# Patient Record
Sex: Male | Born: 1955 | Race: White | Hispanic: No | Marital: Married | Smoking: Former smoker
Health system: Southern US, Community
[De-identification: ages and names within clinical notes are randomized; demographics above are authoritative.]

## PROBLEM LIST (undated history)

## (undated) DIAGNOSIS — Z8739 Personal history of other diseases of the musculoskeletal system and connective tissue: Secondary | ICD-10-CM

## (undated) DIAGNOSIS — E785 Hyperlipidemia, unspecified: Secondary | ICD-10-CM

## (undated) DIAGNOSIS — I10 Essential (primary) hypertension: Secondary | ICD-10-CM

## (undated) DIAGNOSIS — I2510 Atherosclerotic heart disease of native coronary artery without angina pectoris: Secondary | ICD-10-CM

## (undated) HISTORY — PX: TOOTH EXTRACTION: SUR596

## (undated) HISTORY — PX: TONSILECTOMY, ADENOIDECTOMY, BILATERAL MYRINGOTOMY AND TUBES: SHX2538

## (undated) HISTORY — PX: TONSILLECTOMY AND ADENOIDECTOMY: SUR1326

## (undated) HISTORY — PX: FRACTURE SURGERY: SHX138

---

## 1988-01-16 HISTORY — PX: PERCUTANEOUS PINNING PHALANX FRACTURE OF HAND: SUR1027

## 2010-02-05 ENCOUNTER — Encounter: Payer: Self-pay | Admitting: Orthopaedic Surgery

## 2016-05-09 ENCOUNTER — Ambulatory Visit: Payer: Self-pay | Admitting: Cardiovascular Disease

## 2016-05-10 ENCOUNTER — Encounter: Payer: Self-pay | Admitting: Cardiovascular Disease

## 2016-05-10 ENCOUNTER — Encounter: Payer: Self-pay | Admitting: *Deleted

## 2016-05-10 ENCOUNTER — Ambulatory Visit (INDEPENDENT_AMBULATORY_CARE_PROVIDER_SITE_OTHER): Payer: PRIVATE HEALTH INSURANCE | Admitting: Cardiovascular Disease

## 2016-05-10 VITALS — BP 177/107 | HR 66 | Ht 71.0 in | Wt 223.6 lb

## 2016-05-10 DIAGNOSIS — R030 Elevated blood-pressure reading, without diagnosis of hypertension: Secondary | ICD-10-CM | POA: Diagnosis not present

## 2016-05-10 DIAGNOSIS — R072 Precordial pain: Secondary | ICD-10-CM | POA: Diagnosis not present

## 2016-05-10 MED ORDER — ASPIRIN EC 81 MG PO TBEC: 81.0000 mg | DELAYED_RELEASE_TABLET | Freq: Every day | ORAL | Status: AC

## 2016-05-10 NOTE — Progress Notes (Signed)
CARDIOLOGY CONSULT NOTE  Patient ID: Ronald Johnston MRN: [id number] DOB/AGE: Nov 24, 1955 61 y.o.  Admit date: (Not on file) Primary Physician: [name], MD Referring Physician: Neita Carp  Reason for Consultation: chest pain  HPI: Laurin Morgenstern is a 61 y.o. male who is being seen today for the evaluation of chest pain at the request of Sasser, Clarene Critchley, MD.   I was called by Dr. Neita Carp yesterday regarding this patient.   He is hypertensive, BP 164/108.  I personally reviewed an ECG performed on 11/10/15 which showed normal sinus rhythm with no ischemic abnormalities.  I personally reviewed an ECG performed on 05/08/16 which showed sinus rhythm with RSR prime pattern in V1 with no ischemic ST segment or T-wave abnormalities, nor any arrhythmias.  For the past few weeks, he has experienced chest tightness with exertion. Symptoms last about 30-60 seconds. He has been taking Tums with near immediate relief. He has also stopped activities at roughly the same time. He has had some mild associated shortness of breath and some increasing fatigue over the past several months. He denies associated nausea, vomiting, diaphoresis, lightheadedness, dizziness, and syncope.  He said his blood pressure is usually normal and was 124/82 yesterday.  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 Has 3 children. Friends with Dr. Neita Carp.   No Known Allergies  Current Outpatient Prescriptions  Medication Sig Dispense Refill  . allopurinol (ZYLOPRIM) 300 MG tablet TAKE 1 TABELT BY MOUTH DAILY  3   No current facility-administered medications for this visit.     No past medical history on file.  Past Surgical History:  Procedure Laterality Date  .  HAND SX    . TONSILECTOMY, ADENOIDECTOMY, BILATERAL MYRINGOTOMY AND TUBES    . TOOTH EXTRACTION      Social History   Social History  . Marital status: Unknown    Spouse name: N/A  . Number of children: N/A  . Years of education: N/A   Occupational History  . Not on file.   Social History Main Topics  . Smoking status: Never Smoker  . Smokeless tobacco: Never Used  . Alcohol use Not on file  . Drug use: Unknown  . Sexual activity: Not on file   Other Topics Concern  . Not on file   Social History Narrative  . No narrative on file     No family history of premature CAD in 1st degree relatives.  No outpatient prescriptions have been marked as taking for the 05/10/16 encounter (Office Visit) with Laqueta Linden, MD.      Review of systems complete and found to be negative unless listed above in HPI    Physical exam Blood pressure (!) 164/108, pulse 68, height  (1.803 m), weight 223 lb 9.6 oz (101.4 kg), SpO2 98 %. General: NAD Neck: No JVD, no thyromegaly or thyroid nodule.  Lungs: Clear to auscultation bilaterally with normal respiratory effort. CV: Nondisplaced PMI. Regular rate and rhythm, normal S1/S2, no S3/S4, no murmur.  No peripheral edema.  No carotid bruit.    Abdomen: Soft, nontender, obese.  Skin: Intact without lesions or rashes.  Neurologic: Alert and oriented x 3.  Psych: Normal affect. Extremities: No  clubbing or cyanosis.  HEENT: Normal.   ECG: Most recent ECG reviewed.   Labs: No results found for: K, BUN, CREATININE, ALT, TSH, HGB   Lipids: No results found for: LDLCALC, LDLDIRECT, CHOL, TRIG, HDL      ASSESSMENT AND PLAN:  1. Precordial tightness: He has exertional chest tightness which is suspicious for angina. He stops activities and symptoms resolved. He also takes Tums with relief. I will order a 2-D echocardiogram with Doppler to evaluate cardiac structure, function, and regional wall motion. I will proceed with a  nuclear myocardial perfusion imaging study to evaluate for ischemic heart disease (exercise Myoview). He has already been prescribed nitroglycerin by his PCP. I explained to him how to use this. I will start aspirin 81 mg daily. I will obtain a copy of lipids and other labs from PCP for review.  2. Elevated BP: I will monitor this. It is reportedly usually normal.   Disposition: Follow up in 2 weeks  Signed: Prentice Docker, M.D., F.A.C.C.  05/10/2016, 12:16 PM

## 2016-05-10 NOTE — Patient Instructions (Addendum)
Medication Instructions:   Begin Aspirin  daily.  Continue all other medications.    Labwork: none  Testing/Procedures:  Your physician has requested that you have an echocardiogram. Echocardiography is a painless test that uses sound waves to create images of your heart. It provides your doctor with information about the size and shape of your heart and how well your heart's chambers and valves are working. This procedure takes approximately one hour. There are no restrictions for this procedure.  Your physician has requested that you have en exercise stress myoview. For further information please visit [email or web address] Please follow instruction sheet, as given.  Office will contact with results via phone or letter.    Follow-Up: 2 weeks   Any Other Special Instructions Will Be Listed Below (If Applicable).  If you need a refill on your cardiac medications before your next appointment, please call your pharmacy.

## 2016-05-18 ENCOUNTER — Other Ambulatory Visit (HOSPITAL_COMMUNITY)
Admission: RE | Admit: 2016-05-18 | Discharge: 2016-05-18 | Disposition: A | Discharge status: Home / Self Care | Payer: PRIVATE HEALTH INSURANCE | Source: Ambulatory Visit | Attending: Cardiology | Admitting: Cardiology

## 2016-05-18 ENCOUNTER — Ambulatory Visit (HOSPITAL_COMMUNITY)
Admission: RE | Admit: 2016-05-18 | Discharge: 2016-05-18 | Disposition: A | Discharge status: Home / Self Care | Payer: PRIVATE HEALTH INSURANCE | Source: Ambulatory Visit | Attending: Cardiovascular Disease | Admitting: Cardiovascular Disease

## 2016-05-18 ENCOUNTER — Encounter (HOSPITAL_BASED_OUTPATIENT_CLINIC_OR_DEPARTMENT_OTHER)
Admission: RE | Admit: 2016-05-18 | Discharge: 2016-05-18 | Disposition: A | Discharge status: Home / Self Care | Payer: PRIVATE HEALTH INSURANCE | Source: Ambulatory Visit | Attending: Cardiovascular Disease | Admitting: Cardiovascular Disease

## 2016-05-18 ENCOUNTER — Telehealth: Payer: Self-pay

## 2016-05-18 ENCOUNTER — Encounter (HOSPITAL_COMMUNITY)
Admission: RE | Admit: 2016-05-18 | Discharge: 2016-05-18 | Disposition: A | Discharge status: Home / Self Care | Payer: PRIVATE HEALTH INSURANCE | Source: Ambulatory Visit | Attending: Cardiovascular Disease | Admitting: Cardiovascular Disease

## 2016-05-18 ENCOUNTER — Ambulatory Visit (HOSPITAL_COMMUNITY)
Admission: RE | Admit: 2016-05-18 | Discharge: 2016-05-18 | Disposition: A | Discharge status: Home / Self Care | Payer: PRIVATE HEALTH INSURANCE | Source: Ambulatory Visit | Attending: Cardiology | Admitting: Cardiology

## 2016-05-18 ENCOUNTER — Encounter (HOSPITAL_COMMUNITY): Payer: Self-pay

## 2016-05-18 DIAGNOSIS — R072 Precordial pain: Secondary | ICD-10-CM

## 2016-05-18 DIAGNOSIS — R9439 Abnormal result of other cardiovascular function study: Secondary | ICD-10-CM

## 2016-05-18 DIAGNOSIS — R030 Elevated blood-pressure reading, without diagnosis of hypertension: Secondary | ICD-10-CM | POA: Insufficient documentation

## 2016-05-18 DIAGNOSIS — I071 Rheumatic tricuspid insufficiency: Secondary | ICD-10-CM | POA: Diagnosis not present

## 2016-05-18 LAB — ECHOCARDIOGRAM COMPLETE
CHL CUP DOP CALC LVOT VTI: 23 cm
CHL CUP RV SYS PRESS: 17 mmHg
CHL CUP STROKE VOLUME: 61 mL
E/e' ratio: 4.61
EWDT: 232 ms
FS: 31 % (ref 28–44)
IV/PV OW: 1.04
LA ID, A-P, ES: 35 mm
LA diam index: 1.58 cm/m2
LA vol A4C: 68.7 ml
LAVOL: 66.6 mL
LAVOLIN: 30.1 mL/m2
LDCA: 3.46 cm2
LEFT ATRIUM END SYS DIAM: 35 mm
LV E/e' medial: 4.61
LV E/e'average: 4.61
LV dias vol: 100 mL (ref 62–150)
LV sys vol index: 18 mL/m2
LVDIAVOLIN: 45 mL/m2
LVELAT: 12.1 cm/s
LVOT SV: 80 mL
LVOT peak grad rest: 5 mmHg
LVOTD: 21 mm
LVOTPV: 108 cm/s
LVSYSVOL: 39 mL (ref 21–61)
MV Dec: 232
MV pk A vel: 55.5 m/s
MV pk E vel: 55.8 m/s
P 1/2 time: 509 ms
PW: 10.3 mm — AB (ref 0.6–1.1)
RV LATERAL S' VELOCITY: 10.4 cm/s
RV TAPSE: 17.1 mm
Reg peak vel: 184 cm/s
Simpson's disk: 61
TDI e' lateral: 12.1
TDI e' medial: 7.07
TR max vel: 184 cm/s

## 2016-05-18 LAB — CBC WITH DIFFERENTIAL/PLATELET
Basophils Absolute: 0 10*3/uL (ref 0.0–0.1)
Basophils Relative: 0 %
EOS PCT: 3 %
Eosinophils Absolute: 0.2 10*3/uL (ref 0.0–0.7)
HCT: 47.9 % (ref 39.0–52.0)
Hemoglobin: 16.3 g/dL (ref 13.0–17.0)
LYMPHS ABS: 2.5 10*3/uL (ref 0.7–4.0)
LYMPHS PCT: 36 %
MCH: 31.5 pg (ref 26.0–34.0)
MCHC: 34 g/dL (ref 30.0–36.0)
MCV: 92.5 fL (ref 78.0–100.0)
MONO ABS: 0.8 10*3/uL (ref 0.1–1.0)
MONOS PCT: 12 %
Neutro Abs: 3.5 10*3/uL (ref 1.7–7.7)
Neutrophils Relative %: 49 %
PLATELETS: 248 10*3/uL (ref 150–400)
RBC: 5.18 MIL/uL (ref 4.22–5.81)
RDW: 13 % (ref 11.5–15.5)
WBC: 7 10*3/uL (ref 4.0–10.5)

## 2016-05-18 LAB — NM MYOCAR MULTI W/SPECT W/WALL MOTION / EF
CHL CUP NUCLEAR SDS: 10
CHL CUP RESTING HR STRESS: 64 {beats}/min
CHL RATE OF PERCEIVED EXERTION: 14
CSEPEDS: 34 s
CSEPPHR: 137 {beats}/min
Estimated workload: 8.9 METS
Exercise duration (min): 7 min
LVDIAVOL: 109 mL (ref 62–150)
LVSYSVOL: 59 mL
MPHR: 159 {beats}/min
Percent HR: 86 %
RATE: 0.36
SRS: 0
SSS: 10
TID: 1.11

## 2016-05-18 LAB — BASIC METABOLIC PANEL
Anion gap: 9 (ref 5–15)
BUN: 22 mg/dL — AB (ref 6–20)
CHLORIDE: 105 mmol/L (ref 101–111)
CO2: 23 mmol/L (ref 22–32)
Calcium: 10 mg/dL (ref 8.9–10.3)
Creatinine, Ser: 1.18 mg/dL (ref 0.61–1.24)
GFR calc Af Amer: >60 mL/min (ref >60)
GFR calc non Af Amer: >60 mL/min (ref >60)
GLUCOSE: 95 mg/dL (ref 65–99)
POTASSIUM: 3.7 mmol/L (ref 3.5–5.1)
Sodium: 137 mmol/L (ref 135–145)

## 2016-05-18 LAB — PROTIME-INR
INR: 0.97
Prothrombin Time: 12.9 seconds (ref 11.4–15.2)

## 2016-05-18 MED ORDER — SODIUM CHLORIDE 0.9% FLUSH
INTRAVENOUS | Status: AC
  Administered 2016-05-18: 10 mL via INTRAVENOUS
  Filled 2016-05-18: qty 10

## 2016-05-18 MED ORDER — REGADENOSON 0.4 MG/5ML IV SOLN
INTRAVENOUS | Status: AC
  Filled 2016-05-18: qty 5

## 2016-05-18 MED ORDER — TECHNETIUM TC 99M TETROFOSMIN IV KIT
30.0000 | PACK | Freq: Once | INTRAVENOUS | Status: AC | PRN
  Administered 2016-05-18: 30 via INTRAVENOUS

## 2016-05-18 MED ORDER — TECHNETIUM TC 99M TETROFOSMIN IV KIT
10.0000 | PACK | Freq: Once | INTRAVENOUS | Status: AC | PRN
  Administered 2016-05-18: 10 via INTRAVENOUS

## 2016-05-18 MED ORDER — SODIUM CHLORIDE 0.9% FLUSH
INTRAVENOUS | Status: AC
  Filled 2016-05-18: qty 200

## 2016-05-18 NOTE — Progress Notes (Signed)
*  PRELIMINARY RESULTS* Echocardiogram 2D Echocardiogram has been performed.  Stacey DrainWhite, Monica Zahler J 05/18/2016, 9:30 AM

## 2016-05-18 NOTE — Telephone Encounter (Signed)
@  [facility]  [facility] [place] [address] [place] [address] Dept: [phone] Loc: [phone]  Ronald FickleCarl E Johnston Ronald Johnston  05/18/2016  You are scheduled for a Cardiac Catheterization on Tuesday, May 8 with Dr. Verdis PrimeHenry Johnston.  1. [facility]: [address] [place], [place] [address] at 8:30 AM (two hours before your procedure to ensure your preparation). Free valet parking service is available.   Special note: Every effort is made to have your procedure done on time. Please understand that emergencies sometimes delay scheduled procedures.  2. Diet: Do not eat or drink anything after midnight prior to your procedure except sips of water to take medications.  3. Labs: Labs TODAY and chest x-ray  4. Medication instructions in preparation for your procedure:Take Aspirin 81 mg morning of cath  5. Plan for one night stay--bring personal belongings. 6. Bring a current list of your medications and current insurance cards. 7. You MUST have a responsible person to drive you home. 8. Someone MUST be with you the first 24 hours after you arrive home or your discharge will be delayed. 9. Please wear clothes that are easy to get on and off and wear slip-on shoes.  Thank you for allowing us to care for you!   -- [facility]

## 2016-05-18 NOTE — Telephone Encounter (Signed)
-----   Message from Antoine PocheJonathan F Branch, MD sent at 05/18/2016  3:40 PM EDT ----- Markedly abnormal stress test. I have spoken with patient today over phone, he agrees for cath. Please arrange for early next week. Needs left heart cath.   Dominga FerryJ Branch MD

## 2016-05-18 NOTE — Telephone Encounter (Signed)
Scheduled cath with Dr Katrinka BlazingSmith on Tues 5/8 17 at 1030  Dr Katrinka BlazingSmith  Pt will get labs and cxr today    Will forward to Dr Wyline MoodBranch to e-sign CAR order

## 2016-05-21 ENCOUNTER — Other Ambulatory Visit: Payer: Self-pay | Admitting: Cardiology

## 2016-05-21 ENCOUNTER — Telehealth: Payer: Self-pay | Admitting: *Deleted

## 2016-05-21 DIAGNOSIS — R0789 Other chest pain: Secondary | ICD-10-CM

## 2016-05-21 NOTE — Telephone Encounter (Signed)
Notes recorded by Lesle ChrisHill, Airica Schwartzkopf G, LPN on 2/8/41325/07/2016 at 9:38 AM EDT Patient notified. Copy to pmd. ------  Notes recorded by Laqueta LindenSuresh A Koneswaran, MD on 05/18/2016 at 10:03 AM EDT Normal pumping function.

## 2016-05-22 ENCOUNTER — Encounter (HOSPITAL_COMMUNITY)
Admission: RE | Disposition: A | Discharge status: Home / Self Care | Payer: Self-pay | Source: Ambulatory Visit | Attending: Interventional Cardiology

## 2016-05-22 ENCOUNTER — Other Ambulatory Visit: Payer: Self-pay

## 2016-05-22 ENCOUNTER — Ambulatory Visit (HOSPITAL_COMMUNITY)
Admission: RE | Admit: 2016-05-22 | Discharge: 2016-05-23 | Disposition: A | Discharge status: Home / Self Care | Payer: PRIVATE HEALTH INSURANCE | Source: Ambulatory Visit | Attending: Interventional Cardiology | Admitting: Interventional Cardiology

## 2016-05-22 ENCOUNTER — Encounter (HOSPITAL_COMMUNITY): Payer: Self-pay | Admitting: General Practice

## 2016-05-22 DIAGNOSIS — I25110 Atherosclerotic heart disease of native coronary artery with unstable angina pectoris: Secondary | ICD-10-CM | POA: Insufficient documentation

## 2016-05-22 DIAGNOSIS — I10 Essential (primary) hypertension: Secondary | ICD-10-CM | POA: Diagnosis not present

## 2016-05-22 DIAGNOSIS — I2510 Atherosclerotic heart disease of native coronary artery without angina pectoris: Secondary | ICD-10-CM

## 2016-05-22 DIAGNOSIS — Z955 Presence of coronary angioplasty implant and graft: Secondary | ICD-10-CM

## 2016-05-22 DIAGNOSIS — R931 Abnormal findings on diagnostic imaging of heart and coronary circulation: Secondary | ICD-10-CM

## 2016-05-22 DIAGNOSIS — R0789 Other chest pain: Secondary | ICD-10-CM

## 2016-05-22 DIAGNOSIS — E785 Hyperlipidemia, unspecified: Secondary | ICD-10-CM | POA: Diagnosis not present

## 2016-05-22 DIAGNOSIS — I200 Unstable angina: Secondary | ICD-10-CM | POA: Diagnosis present

## 2016-05-22 DIAGNOSIS — I209 Angina pectoris, unspecified: Secondary | ICD-10-CM

## 2016-05-22 HISTORY — DX: Personal history of other diseases of the musculoskeletal system and connective tissue: Z87.39

## 2016-05-22 HISTORY — PX: CORONARY STENT INTERVENTION: CATH118234

## 2016-05-22 HISTORY — DX: Essential (primary) hypertension: I10

## 2016-05-22 HISTORY — DX: Hyperlipidemia, unspecified: E78.5

## 2016-05-22 HISTORY — PX: CORONARY ANGIOPLASTY WITH STENT PLACEMENT: SHX49

## 2016-05-22 HISTORY — DX: Atherosclerotic heart disease of native coronary artery without angina pectoris: I25.10

## 2016-05-22 HISTORY — PX: LEFT HEART CATH AND CORONARY ANGIOGRAPHY: CATH118249

## 2016-05-22 LAB — POCT ACTIVATED CLOTTING TIME
ACTIVATED CLOTTING TIME: 290 s
ACTIVATED CLOTTING TIME: 307 s

## 2016-05-22 SURGERY — LEFT HEART CATH AND CORONARY ANGIOGRAPHY
Anesthesia: LOCAL

## 2016-05-22 MED ORDER — SODIUM CHLORIDE 0.9% FLUSH: 3.0000 mL | Freq: Two times a day (BID) | INTRAVENOUS | Status: DC

## 2016-05-22 MED ORDER — SODIUM CHLORIDE 0.9 % IV SOLN: 250.0000 mL | INTRAVENOUS | Status: DC | PRN

## 2016-05-22 MED ORDER — NITROGLYCERIN 1 MG/10 ML FOR IR/CATH LAB
INTRA_ARTERIAL | Status: DC | PRN
  Administered 2016-05-22: 200 ug via INTRACORONARY

## 2016-05-22 MED ORDER — SODIUM CHLORIDE 0.9 % WEIGHT BASED INFUSION: 1.0000 mL/kg/h | INTRAVENOUS | Status: DC

## 2016-05-22 MED ORDER — FENTANYL CITRATE (PF) 100 MCG/2ML IJ SOLN
INTRAMUSCULAR | Status: DC | PRN
  Administered 2016-05-22 (×2): 50 ug via INTRAVENOUS

## 2016-05-22 MED ORDER — OXYCODONE-ACETAMINOPHEN 5-325 MG PO TABS: 1.0000 | ORAL_TABLET | ORAL | Status: DC | PRN

## 2016-05-22 MED ORDER — MIDAZOLAM HCL 2 MG/2ML IJ SOLN
INTRAMUSCULAR | Status: AC
  Filled 2016-05-22: qty 2

## 2016-05-22 MED ORDER — IOPAMIDOL (ISOVUE-370) INJECTION 76%
INTRAVENOUS | Status: DC | PRN
  Administered 2016-05-22: 200 mL via INTRA_ARTERIAL

## 2016-05-22 MED ORDER — HEPARIN SODIUM (PORCINE) 1000 UNIT/ML IJ SOLN
INTRAMUSCULAR | Status: AC
  Filled 2016-05-22: qty 1

## 2016-05-22 MED ORDER — HYDRALAZINE HCL 20 MG/ML IJ SOLN
5.0000 mg | INTRAMUSCULAR | Status: AC | PRN
  Administered 2016-05-22: 5 mg via INTRAVENOUS
  Filled 2016-05-22: qty 1

## 2016-05-22 MED ORDER — HEPARIN (PORCINE) IN NACL 2-0.9 UNIT/ML-% IJ SOLN
INTRAMUSCULAR | Status: AC
  Filled 2016-05-22: qty 1000

## 2016-05-22 MED ORDER — IOPAMIDOL (ISOVUE-370) INJECTION 76%
INTRAVENOUS | Status: AC
  Filled 2016-05-22: qty 50

## 2016-05-22 MED ORDER — ACETAMINOPHEN 325 MG PO TABS: 650.0000 mg | ORAL_TABLET | ORAL | Status: DC | PRN

## 2016-05-22 MED ORDER — LIDOCAINE HCL (PF) 1 % IJ SOLN
INTRAMUSCULAR | Status: DC | PRN
  Administered 2016-05-22: 2 mL via INTRADERMAL

## 2016-05-22 MED ORDER — HEPARIN SODIUM (PORCINE) 5000 UNIT/ML IJ SOLN
5000.0000 [IU] | Freq: Three times a day (TID) | INTRAMUSCULAR | Status: DC
  Filled 2016-05-22: qty 1

## 2016-05-22 MED ORDER — LIDOCAINE HCL (PF) 1 % IJ SOLN
INTRAMUSCULAR | Status: AC
  Filled 2016-05-22: qty 30

## 2016-05-22 MED ORDER — ONDANSETRON HCL 4 MG/2ML IJ SOLN: 4.0000 mg | Freq: Four times a day (QID) | INTRAMUSCULAR | Status: DC | PRN

## 2016-05-22 MED ORDER — TICAGRELOR 90 MG PO TABS
90.0000 mg | ORAL_TABLET | Freq: Two times a day (BID) | ORAL | Status: DC
  Administered 2016-05-23: 01:00:00 90 mg via ORAL
  Filled 2016-05-22: qty 1

## 2016-05-22 MED ORDER — VERAPAMIL HCL 2.5 MG/ML IV SOLN
INTRAVENOUS | Status: AC
  Filled 2016-05-22: qty 2

## 2016-05-22 MED ORDER — SODIUM CHLORIDE 0.9% FLUSH: 3.0000 mL | INTRAVENOUS | Status: DC | PRN

## 2016-05-22 MED ORDER — FENTANYL CITRATE (PF) 100 MCG/2ML IJ SOLN
INTRAMUSCULAR | Status: AC
  Filled 2016-05-22: qty 2

## 2016-05-22 MED ORDER — ASPIRIN 81 MG PO CHEW: 81.0000 mg | CHEWABLE_TABLET | Freq: Every day | ORAL | Status: DC

## 2016-05-22 MED ORDER — LABETALOL HCL 5 MG/ML IV SOLN
10.0000 mg | INTRAVENOUS | Status: AC | PRN
  Administered 2016-05-22: 5 mg via INTRAVENOUS
  Filled 2016-05-22: qty 4

## 2016-05-22 MED ORDER — SODIUM CHLORIDE 0.9 % WEIGHT BASED INFUSION
3.0000 mL/kg/h | INTRAVENOUS | Status: DC
  Administered 2016-05-22: 3 mL/kg/h via INTRAVENOUS

## 2016-05-22 MED ORDER — IOPAMIDOL (ISOVUE-370) INJECTION 76%
INTRAVENOUS | Status: AC
  Filled 2016-05-22: qty 125

## 2016-05-22 MED ORDER — TICAGRELOR 90 MG PO TABS
ORAL_TABLET | ORAL | Status: AC
  Filled 2016-05-22: qty 2

## 2016-05-22 MED ORDER — IOPAMIDOL (ISOVUE-370) INJECTION 76%
INTRAVENOUS | Status: AC
  Filled 2016-05-22: qty 100

## 2016-05-22 MED ORDER — SODIUM CHLORIDE 0.9 % IV SOLN
INTRAVENOUS | Status: AC
  Administered 2016-05-22: 17:00:00 via INTRAVENOUS

## 2016-05-22 MED ORDER — VERAPAMIL HCL 2.5 MG/ML IV SOLN
INTRAVENOUS | Status: DC | PRN
  Administered 2016-05-22: 10 mL via INTRA_ARTERIAL

## 2016-05-22 MED ORDER — MIDAZOLAM HCL 2 MG/2ML IJ SOLN
INTRAMUSCULAR | Status: DC | PRN
  Administered 2016-05-22 (×3): 1 mg via INTRAVENOUS

## 2016-05-22 MED ORDER — HEPARIN (PORCINE) IN NACL 2-0.9 UNIT/ML-% IJ SOLN
INTRAMUSCULAR | Status: DC | PRN
  Administered 2016-05-22: 2000 mL via INTRA_ARTERIAL

## 2016-05-22 MED ORDER — NITROGLYCERIN 1 MG/10 ML FOR IR/CATH LAB
INTRA_ARTERIAL | Status: AC
  Filled 2016-05-22: qty 10

## 2016-05-22 MED ORDER — TICAGRELOR 90 MG PO TABS
ORAL_TABLET | ORAL | Status: DC | PRN
  Administered 2016-05-22: 180 mg via ORAL

## 2016-05-22 MED ORDER — ASPIRIN 81 MG PO CHEW: 81.0000 mg | CHEWABLE_TABLET | ORAL | Status: DC

## 2016-05-22 MED ORDER — HEPARIN SODIUM (PORCINE) 1000 UNIT/ML IJ SOLN
INTRAMUSCULAR | Status: DC | PRN
  Administered 2016-05-22: 6000 [IU] via INTRAVENOUS
  Administered 2016-05-22: 5000 [IU] via INTRAVENOUS
  Administered 2016-05-22: 2000 [IU] via INTRAVENOUS

## 2016-05-22 SURGICAL SUPPLY — 21 items
BALLN EMERGE MR 2.5X12 (BALLOONS) ×2
BALLN ~~LOC~~ EUPHORA RX 3.75X8 (BALLOONS) ×2
BALLOON EMERGE MR 2.5X12 (BALLOONS) IMPLANT
BALLOON ~~LOC~~ EUPHORA RX 3.75X8 (BALLOONS) IMPLANT
CATH INFINITI 5 FR JL3.5 (CATHETERS) ×2 IMPLANT
CATH INFINITI JR4 5F (CATHETERS) ×1 IMPLANT
CATH LAUNCHER 6FR EBU3.5 (CATHETERS) ×1 IMPLANT
CATH VISTA GUIDE 6FR XBLAD3.5 (CATHETERS) ×1 IMPLANT
COVER PRB 48X5XTLSCP FOLD TPE (BAG) IMPLANT
COVER PROBE 5X48 (BAG) ×2
DEVICE RAD COMP TR BAND LRG (VASCULAR PRODUCTS) ×1 IMPLANT
GLIDESHEATH SLEND A-KIT 6F 22G (SHEATH) ×1 IMPLANT
GUIDEWIRE INQWIRE 1.5J.035X260 (WIRE) IMPLANT
INQWIRE 1.5J .035X260CM (WIRE) ×2
KIT ENCORE 26 ADVANTAGE (KITS) ×2 IMPLANT
KIT HEART LEFT (KITS) ×2 IMPLANT
PACK CARDIAC CATHETERIZATION (CUSTOM PROCEDURE TRAY) ×2 IMPLANT
STENT RESOLUTE ONYX 3.5X15 (Permanent Stent) ×1 IMPLANT
TRANSDUCER W/STOPCOCK (MISCELLANEOUS) ×2 IMPLANT
TUBING CIL FLEX 10 FLL-RA (TUBING) ×2 IMPLANT
WIRE ASAHI PROWATER 180CM (WIRE) ×1 IMPLANT

## 2016-05-22 NOTE — Care Management Note (Addendum)
Case Management Note  Patient Details  Name: Ronald Johnston MRN: [id number] Date of Birth: 1955-01-25  Subjective/Objective:   From home with wife, s/p coronary stent intervention, will be on brilinta. NCM gave patient the 30 day savings card, [facility] [place] in [facility].                   Action/Plan: PTA indep, NCM will follow for dc needs also awaiting benefit check for brilinta.   Expected Discharge Date:                  Expected Discharge Plan:  Home/Self Care  In-House Referral:     Discharge planning Services  CM Consult  Post Acute Care Choice:    Choice offered to:     DME Arranged:    DME Agency:     HH Arranged:    HH Agency:     Status of Service:  In process, will continue to follow  If discussed at Long Length of Stay Meetings, dates discussed:    Additional Comments:  Leone Havenaylor, Umi Mainor Clinton, RN 05/22/2016, 4:25 PM

## 2016-05-22 NOTE — H&P (View-Only) (Signed)
CARDIOLOGY CONSULT NOTE  Patient ID: Ronald Johnston MRN: [id number] DOB/AGE: Nov 24, 1955 61 y.o.  Admit date: (Not on file) Primary Physician: [name], MD Referring Physician: Neita Carp  Reason for Consultation: chest pain  HPI: Ronald Johnston is a 61 y.o. male who is being seen today for the evaluation of chest pain at the request of Sasser, Clarene Critchley, MD.   I was called by Dr. Neita Carp yesterday regarding this patient.   He is hypertensive, BP 164/108.  I personally reviewed an ECG performed on 11/10/15 which showed normal sinus rhythm with no ischemic abnormalities.  I personally reviewed an ECG performed on 05/08/16 which showed sinus rhythm with RSR prime pattern in V1 with no ischemic ST segment or T-wave abnormalities, nor any arrhythmias.  For the past few weeks, he has experienced chest tightness with exertion. Symptoms last about 30-60 seconds. He has been taking Tums with near immediate relief. He has also stopped activities at roughly the same time. He has had some mild associated shortness of breath and some increasing fatigue over the past several months. He denies associated nausea, vomiting, diaphoresis, lightheadedness, dizziness, and syncope.  He said his blood pressure is usually normal and was 124/82 yesterday.  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 Has 3 children. Friends with Dr. Neita Carp.   No Known Allergies  Current Outpatient Prescriptions  Medication Sig Dispense Refill  . allopurinol (ZYLOPRIM) 300 MG tablet TAKE 1 TABELT BY MOUTH DAILY  3   No current facility-administered medications for this visit.     No past medical history on file.  Past Surgical History:  Procedure Laterality Date  .  HAND SX    . TONSILECTOMY, ADENOIDECTOMY, BILATERAL MYRINGOTOMY AND TUBES    . TOOTH EXTRACTION      Social History   Social History  . Marital status: Unknown    Spouse name: N/A  . Number of children: N/A  . Years of education: N/A   Occupational History  . Not on file.   Social History Main Topics  . Smoking status: Never Smoker  . Smokeless tobacco: Never Used  . Alcohol use Not on file  . Drug use: Unknown  . Sexual activity: Not on file   Other Topics Concern  . Not on file   Social History Narrative  . No narrative on file     No family history of premature CAD in 1st degree relatives.  No outpatient prescriptions have been marked as taking for the 05/10/16 encounter (Office Visit) with Laqueta Linden, MD.      Review of systems complete and found to be negative unless listed above in HPI    Physical exam Blood pressure (!) 164/108, pulse 68, height  (1.803 m), weight 223 lb 9.6 oz (101.4 kg), SpO2 98 %. General: NAD Neck: No JVD, no thyromegaly or thyroid nodule.  Lungs: Clear to auscultation bilaterally with normal respiratory effort. CV: Nondisplaced PMI. Regular rate and rhythm, normal S1/S2, no S3/S4, no murmur.  No peripheral edema.  No carotid bruit.    Abdomen: Soft, nontender, obese.  Skin: Intact without lesions or rashes.  Neurologic: Alert and oriented x 3.  Psych: Normal affect. Extremities: No  clubbing or cyanosis.  HEENT: Normal.   ECG: Most recent ECG reviewed.   Labs: No results found for: K, BUN, CREATININE, ALT, TSH, HGB   Lipids: No results found for: LDLCALC, LDLDIRECT, CHOL, TRIG, HDL      ASSESSMENT AND PLAN:  1. Precordial tightness: He has exertional chest tightness which is suspicious for angina. He stops activities and symptoms resolved. He also takes Tums with relief. I will order a 2-D echocardiogram with Doppler to evaluate cardiac structure, function, and regional wall motion. I will proceed with a  nuclear myocardial perfusion imaging study to evaluate for ischemic heart disease (exercise Myoview). He has already been prescribed nitroglycerin by his PCP. I explained to him how to use this. I will start aspirin 81 mg daily. I will obtain a copy of lipids and other labs from PCP for review.  2. Elevated BP: I will monitor this. It is reportedly usually normal.   Disposition: Follow up in 2 weeks  Signed: Prentice DockerSuresh Javares Kaufhold, M.D., F.A.C.C.  05/10/2016, 12:16 PM

## 2016-05-22 NOTE — Interval H&P Note (Signed)
Cath Lab Visit (complete for each Cath Lab visit)  Clinical Evaluation Leading to the Procedure:   ACS: No.  Non-ACS:    Anginal Classification: CCS III  Anti-ischemic medical therapy: Minimal Therapy (1 class of medications)  Non-Invasive Test Results: High-risk stress test findings: cardiac mortality >3%/year  Prior CABG: No previous CABG      History and Physical Interval Note:  05/22/2016 12:58 PM  Renae Ficklearl E Jr Dreier  has presented today for surgery, with the diagnosis of abnormal stress test  The various methods of treatment have been discussed with the patient and family. After consideration of risks, benefits and other options for treatment, the patient has consented to  Procedure(s): Left Heart Cath and Coronary Angiography (N/A) as a surgical intervention .  The patient's history has been reviewed, patient examined, no change in status, stable for surgery.  I have reviewed the patient's chart and labs.  Questions were answered to the patient's satisfaction.     Lyn RecordsHenry W Milfred Krammes III

## 2016-05-22 NOTE — Progress Notes (Signed)
#   7 S/W LOLA @ [facility] # [id number]    1.BRILINTA  90 MG BID     COVER- YES  CO-PAY- $ 70.00  TIER- 2 DRUG  PRIOR APPROVAL- NO   2. TICAGRELOR- NONE FORMULARY   3. CLOPIDOGREL 75 MG BID   COVER- YES  CO-PAY- $ 10.00  TIER- 3 DRUG  PRIOR APPROVAL- NO   [facility]   PLEASE ASK PATIENT TO GIVE COPY OF INS CARD TO ADMISSION

## 2016-05-22 NOTE — Progress Notes (Signed)
TR BAND REMOVAL  LOCATION:    right radial  DEFLATED PER PROTOCOL:    Yes.    TIME BAND OFF / DRESSING APPLIED:    1830   SITE UPON ARRIVAL:    Level 0  SITE AFTER BAND REMOVAL:    Level 0  CIRCULATION SENSATION AND MOVEMENT:    Within Normal Limits   Yes.    COMMENTS:  No complications with band removal

## 2016-05-22 NOTE — CV Procedure (Signed)
   The angiography demonstrated a Ruptured Plaque in the Proximal LAD and Moderate to Moderately Severe Mid LAD Disease beyond the Large Diagonal.  There are no lower the LAD is of the right coronary and circumflex.  Overall LV function is normal.  Successful stenting of focal region in the proximal LAD from 90+ percent to 0% with a 15 x 3.5 mm Aches DES Postdilated to 3.75 Mm.

## 2016-05-22 NOTE — Research (Addendum)
OPTIMIZE Informed Consent   Subject Name: Ronald Johnston  Subject met inclusion and exclusion criteria.  The informed consent form, study requirements and expectations were reviewed with the subject and questions and concerns were addressed prior to the signing of the consent form.  The subject verbalized understanding of the trail requirements.  The subject agreed to participate in the OPTIMIZE trial and signed the informed consent.  The informed consent was obtained prior to performance of any protocol-specific procedures for the subject.  A copy of the signed informed consent was given to the subject and a copy was placed in the subject's medical record. Applicable if randomized.   Philemon Kingdom D 05/22/2016, 0845 am

## 2016-05-23 ENCOUNTER — Encounter (HOSPITAL_COMMUNITY): Payer: Self-pay | Admitting: Interventional Cardiology

## 2016-05-23 DIAGNOSIS — I25110 Atherosclerotic heart disease of native coronary artery with unstable angina pectoris: Secondary | ICD-10-CM | POA: Diagnosis not present

## 2016-05-23 DIAGNOSIS — R931 Abnormal findings on diagnostic imaging of heart and coronary circulation: Secondary | ICD-10-CM

## 2016-05-23 DIAGNOSIS — I10 Essential (primary) hypertension: Secondary | ICD-10-CM

## 2016-05-23 DIAGNOSIS — I2510 Atherosclerotic heart disease of native coronary artery without angina pectoris: Secondary | ICD-10-CM

## 2016-05-23 DIAGNOSIS — E785 Hyperlipidemia, unspecified: Secondary | ICD-10-CM

## 2016-05-23 LAB — CBC
HCT: 45 % (ref 39.0–52.0)
HEMOGLOBIN: 15.3 g/dL (ref 13.0–17.0)
MCH: 31 pg (ref 26.0–34.0)
MCHC: 34 g/dL (ref 30.0–36.0)
MCV: 91.1 fL (ref 78.0–100.0)
Platelets: 249 10*3/uL (ref 150–400)
RBC: 4.94 MIL/uL (ref 4.22–5.81)
RDW: 13 % (ref 11.5–15.5)
WBC: 7.1 10*3/uL (ref 4.0–10.5)

## 2016-05-23 LAB — BASIC METABOLIC PANEL
ANION GAP: 9 (ref 5–15)
BUN: 13 mg/dL (ref 6–20)
CHLORIDE: 107 mmol/L (ref 101–111)
CO2: 21 mmol/L — ABNORMAL LOW (ref 22–32)
Calcium: 8.8 mg/dL — ABNORMAL LOW (ref 8.9–10.3)
Creatinine, Ser: 0.94 mg/dL (ref 0.61–1.24)
GFR calc Af Amer: >60 mL/min (ref >60)
GFR calc non Af Amer: >60 mL/min (ref >60)
Glucose, Bld: 94 mg/dL (ref 65–99)
Potassium: 3.6 mmol/L (ref 3.5–5.1)
Sodium: 137 mmol/L (ref 135–145)

## 2016-05-23 MED ORDER — METOPROLOL TARTRATE 12.5 MG HALF TABLET
12.5000 mg | ORAL_TABLET | Freq: Two times a day (BID) | ORAL | Status: DC
  Filled 2016-05-23: qty 1

## 2016-05-23 MED ORDER — ATORVASTATIN CALCIUM 80 MG PO TABS: 80.0000 mg | ORAL_TABLET | Freq: Every day | ORAL | 6 refills | Status: DC

## 2016-05-23 MED ORDER — ANGIOPLASTY BOOK
Freq: Once | Status: DC
  Filled 2016-05-23: qty 1

## 2016-05-23 MED ORDER — TICAGRELOR 90 MG PO TABS: 90.0000 mg | ORAL_TABLET | Freq: Two times a day (BID) | ORAL | 10 refills | Status: DC

## 2016-05-23 MED ORDER — METOPROLOL TARTRATE 25 MG PO TABS: 12.5000 mg | ORAL_TABLET | Freq: Two times a day (BID) | ORAL | 3 refills | Status: DC

## 2016-05-23 MED ORDER — TICAGRELOR 90 MG PO TABS: 90.0000 mg | ORAL_TABLET | Freq: Two times a day (BID) | ORAL | 0 refills | Status: DC

## 2016-05-23 MED ORDER — NITROGLYCERIN 0.4 MG SL SUBL: 0.4000 mg | SUBLINGUAL_TABLET | SUBLINGUAL | 3 refills | Status: DC | PRN

## 2016-05-23 NOTE — Care Management Note (Signed)
Case Management Note  Patient Details  Name: Ronald Johnston MRN: [id number] Date of Birth: 02/08/1955  Subjective/Objective:   From home with wife, s/p coronary stent intervention, will be on brilinta. NCM gave patient the 30 day savings card, [facility] [place] in [facility].  Informed patient  That if co pay of 70.00 is too high to let his MD know at follow up, his insurance will cover plavix for 1000.  Patient is for dc today.                               Action/Plan:   Expected Discharge Date:                  Expected Discharge Plan:  Home/Self Care  In-House Referral:     Discharge planning Services  CM Consult  Post Acute Care Choice:    Choice offered to:     DME Arranged:    DME Agency:     HH Arranged:    HH Agency:     Status of Service:  Completed, signed off  If discussed at [employer] Length of Stay Meetings, dates discussed:    Additional Comments:  Leone Havenaylor, Weber Monnier Clinton, RN 05/23/2016, 8:37 AM

## 2016-05-23 NOTE — Progress Notes (Signed)
Offered Pt a bath. Pt refused and requested a tube of toothpaste. Tech gave Pt oral supplies.

## 2016-05-23 NOTE — Progress Notes (Signed)
CARDIAC REHAB PHASE I   PRE:  Rate/Rhythm: 70 SR    BP: sitting 159/80    SaO2:   MODE:  Ambulation: 430 ft   POST:  Rate/Rhythm: 85 SR    BP: sitting 173/105 dinamapp, 140/90 manual     SaO2:   Tolerated well, no c/o. BP elevated although manual is better than machine. Ed completed with pt and wife. Voiced understanding and requests his [facility] be [facility] to [facility].  He will need to navigate work. [phone]  Harriet Massonandi Kristan Lamya Lausch CES, ACSM 05/23/2016 8:46 AM

## 2016-05-23 NOTE — Discharge Summary (Signed)
Discharge Summary    Patient ID: Ronald Johnston,  MRN: [id number], DOB/AGE: 1955/07/28 61 y.o.  Admit date: 05/22/2016 Discharge date: 05/23/2016  Primary Care Provider: Estanislado PandySasser, Paul W Primary Cardiologist: Purvis SheffieldKoneswaran   Discharge Diagnoses    Active Problems:   CAD (coronary artery disease), native coronary artery   Angina pectoris [facility](HCC)   Abnormal nuclear cardiac imaging test   Accelerating angina [facility](HCC)   Essential hypertension   Hyperlipidemia   Allergies No Known Allergies  Diagnostic Studies/Procedures    LHC: 05/22/16  Conclusion    Thrombotic eccentric 85-95% proximal LAD stenosis. Intermedius stenosis in the mid LAD over a more diffuse area.  Irregularities noted in the circumflex artery but no high-grade obstruction.  Widely patent right coronary vessel arises slightly anterior no the left sinus of Valsalva.  Successful angioplasty and stenting of the proximal LAD 90% stenosis with reduction to 0% using a 3.5 x 15 Onyx DES postdilated to 3.75 mm in diameter. TIMI grade 3 flow was noted. No complications.  Normal LV function. EF greater than 50%. LVEDP is normal.  Recommendations:   Aspirin and brilinta for at least 6 months.  Aggressive risk factor modification.  Cardiac rehabilitation, phase II  _____________   History of Present Illness     61 yo male who was referred to Dr. Purvis SheffieldKoneswaran by Dr. Neita CarpSasser for ongoing chest pain. In the office he described exertion chest tightness that was relieved by rest. His blood pressure was also noted to be elevated in the office. He was referred for a stress test that was abnormal with large area of moderate to severe ischemia in the anterior, apical and anteroseptal walls. He was referred for outpatient cardiac cath.   Hospital Course      Underwent cardiac cath with Dr. Katrinka BlazingSmith on 05/22/16 noted above with DES x 1 placed to the pLAD with TIMI grade 3 flow. Normal LV function noted, and normal LVEDP. Plan for DAPT  ASA and Brilinta for one year. His blood pressure remained elevated this admission. Therefore will add metoprolol 12.5mg  BID. Also add high dose statin at Lipitor 80mg . Plan for follow up CMET and Lipid panel at office visit. Denies any chest pain overnight. Worked well with cardiac rehab.   He was seen and assessed by Dr. Excell Seltzerooper and determined stable for discharge home. Follow up has been arranged in the office. He was given a work noted prior to discharge. Medications are listed below.   General: Well developed, well nourished, male appearing in no acute distress. Head: Normocephalic, atraumatic.  Neck: Supple without bruits, JVD. Lungs:  Resp regular and unlabored, CTA. Heart: RRR, S1, S2, no S3, S4, or murmur; no rub. Abdomen: Soft, non-tender, non-distended with normoactive bowel sounds. No hepatomegaly. No rebound/guarding. No obvious abdominal masses. Extremities: No clubbing, cyanosis, edema. Distal pedal pulses are 2+ bilaterally. L radial cath site stable without bruising or hematoma Neuro: Alert and oriented X 3. Moves all extremities spontaneously. Psych: Normal affect.  _____________  Discharge Vitals Blood pressure (!) 173/105, pulse 67, temperature 98 F (36.7 C), temperature source Oral, resp. rate 16, height 5\' 11"  (1.803 m), weight 207 lb 3.7 oz (94 kg), SpO2 96 %.  Filed Weights   05/22/16 0829 05/23/16 0630  Weight: 216 lb (98 kg) 207 lb 3.7 oz (94 kg)    Labs & Radiologic Studies    CBC  Recent Labs  05/23/16 0300  WBC 7.1  HGB 15.3  HCT 45.0  MCV 91.1  PLT 249   Basic Metabolic Panel  Recent Labs  05/23/16 0300  NA 137  K 3.6  CL 107  CO2 21*  GLUCOSE 94  BUN 13  CREATININE 0.94  CALCIUM 8.8*   Liver Function Tests No results for input(s): AST, ALT, ALKPHOS, BILITOT, PROT, ALBUMIN in the last 72 hours. No results for input(s): LIPASE, AMYLASE in the last 72 hours. Cardiac Enzymes No results for input(s): CKTOTAL, CKMB, CKMBINDEX, TROPONINI  in the last 72 hours. BNP Invalid input(s): POCBNP D-Dimer No results for input(s): DDIMER in the last 72 hours. Hemoglobin A1C No results for input(s): HGBA1C in the last 72 hours. Fasting Lipid Panel No results for input(s): CHOL, HDL, LDLCALC, TRIG, CHOLHDL, LDLDIRECT in the last 72 hours. Thyroid Function Tests No results for input(s): TSH, T4TOTAL, T3FREE, THYROIDAB in the last 72 hours.  Invalid input(s): FREET3 _____________  Dg Chest 2 View  Result Date: 05/18/2016 CLINICAL DATA:  Chest tightness and shortness of breath. EXAM: CHEST  2 VIEW COMPARISON:  None. FINDINGS: The heart size and mediastinal contours are within normal limits. Both lungs are clear. The visualized skeletal structures are unremarkable. IMPRESSION: No active cardiopulmonary disease. Electronically Signed   By: Gerome Sam III M.D   On: 05/18/2016 20:33   Nm Myocar Multi W/spect Izetta Dakin Motion / Ef  Result Date: 05/18/2016  Blood pressure demonstrated a normal response to exercise.  Downsloping ST segment depression ST segment depression of 4 mm was noted during stress in the II, III, aVF, V4, V5 and V6 leads.  Findings consistent with large area of moderate to severe ischemia in the anterior, apical, and anteroseptal walls  This is a high risk study. High risk based on high risk exercise and imaging findings. High risk Duke treadmill score of negative 12.  The left ventricular ejection fraction is moderately decreased (30-44%).    Disposition   Pt is being discharged home today in good condition.  Follow-up Plans & Appointments    Follow-up Information    Laurann Montana, PA-C Follow up on 06/01/2016.   Specialties:  Cardiology, Radiology Why:  at 1:30pm for your follow up appt.  Contact information: [address] [place] [address] [phone]          Discharge Instructions    Amb Referral to Cardiac Rehabilitation    Complete by:  As directed    Diagnosis:   PTCA Coronary Stents       Call MD for:  redness, tenderness, or signs of infection (pain, swelling, redness, odor or green/yellow discharge around incision site)    Complete by:  As directed    Diet - low sodium heart healthy    Complete by:  As directed    Discharge instructions    Complete by:  As directed    Radial Site Care Refer to this sheet in the next few weeks. These instructions provide you with information on caring for yourself after your procedure. Your caregiver may also give you more specific instructions. Your treatment has been planned according to current medical practices, but problems sometimes occur. Call your caregiver if you have any problems or questions after your procedure. HOME CARE INSTRUCTIONS You may shower the day after the procedure.Remove the bandage (dressing) and gently wash the site with plain soap and water.Gently pat the site dry.  Do not apply powder or lotion to the site.  Do not submerge the affected site in water for 3 to 5 days.  Inspect the site  at least twice daily.  Do not flex or bend the affected arm for 24 hours.  No lifting over 5 pounds (2.3 kg) for 5 days after your procedure.  Do not drive home if you are discharged the same day of the procedure. Have someone else drive you.  You may drive 24 hours after the procedure unless otherwise instructed by your caregiver.  What to expect: Any bruising will usually fade within 1 to 2 weeks.  Blood that collects in the tissue (hematoma) may be painful to the touch. It should usually decrease in size and tenderness within 1 to 2 weeks.  SEEK IMMEDIATE MEDICAL CARE IF: You have unusual pain at the radial site.  You have redness, warmth, swelling, or pain at the radial site.  You have drainage (other than a small amount of blood on the dressing).  You have chills.  You have a fever or persistent symptoms for more than 72 hours.  You have a fever and your symptoms suddenly get worse.  Your arm becomes pale, cool, tingly,  or numb.  You have heavy bleeding from the site. Hold pressure on the site.   Increase activity slowly    Complete by:  As directed       Discharge Medications   Current Discharge Medication List    START taking these medications   Details  atorvastatin (LIPITOR) 80 MG tablet Take 1 tablet (80 mg total) by mouth daily. Qty: 30 tablet, Refills: 6    metoprolol tartrate (LOPRESSOR) 25 MG tablet Take 0.5 tablets (12.5 mg total) by mouth 2 (two) times daily. Qty: 60 tablet, Refills: 3    nitroGLYCERIN (NITROSTAT) 0.4 MG SL tablet Place 1 tablet (0.4 mg total) under the tongue every 5 (five) minutes as needed. Qty: 25 tablet, Refills: 3    ticagrelor (BRILINTA) 90 MG TABS tablet Take 1 tablet (90 mg total) by mouth 2 (two) times daily. Qty: 60 tablet, Refills: 0      CONTINUE these medications which have NOT CHANGED   Details  allopurinol (ZYLOPRIM) 300 MG tablet TAKE 1 TABELT BY MOUTH DAILY Refills: 3    aspirin EC 81 MG tablet Take 1 tablet (81 mg total) by mouth daily.         Aspirin prescribed at discharge?  Yes High Intensity Statin Prescribed? (Lipitor 40-80mg  or Crestor 20-40mg ): Yes Beta Blocker Prescribed? Yes For EF <40%, was ACEI/ARB Prescribed? No: Consider at outpatient follow up ADP Receptor Inhibitor Prescribed? (i.e. Plavix etc.-Includes Medically Managed Patients): Yes For EF <40%, Aldosterone Inhibitor Prescribed? No: EF ok Was EF assessed during THIS hospitalization? Yes  Was Cardiac Rehab II ordered? (Included Medically managed Patients): Yes   Outstanding Labs/Studies   CMET/Lipid panel at follow up visit. FLP/LFTs in 6 weeks if able to tolerate statin.   Duration of Discharge Encounter   Greater than 30 minutes including physician time.  Signed, Laverda Page NP-C 05/23/2016, 8:58 AM  Patient seen, examined. Available data reviewed. Agree with findings, assessment, and plan as outlined by Laverda Page, NP-C. Pt independently evaluated.  Exam reveals: Pt is alert and oriented, NAD HEENT: normal Neck: JVP - normal Lungs: CTA bilaterally CV: RRR without murmur or gallop Abd: soft, NT Ext: no C/C/E, distal pulses intact and equal, right radial site clear Skin: warm/dry no rash  Reviewed post-PCI instructions and medical program with patient. He is stable for discharge. Plans as outlined above.   Tonny Bollman, M.D. 05/23/2016 9:00 AM

## 2016-05-24 ENCOUNTER — Telehealth: Payer: Self-pay

## 2016-05-24 ENCOUNTER — Encounter (HOSPITAL_COMMUNITY): Payer: Self-pay | Admitting: Interventional Cardiology

## 2016-05-24 NOTE — Telephone Encounter (Signed)
Patient contacted regarding discharge from [facility] on 06/01/16.  Patient understands to follow up with provider Dunn PA-C on 06/01/16 at 130pm at [place]. Patient understands discharge instructions? yes Patient understands medications and regiment? yes Patient understands to bring all medications to this visit? yes

## 2016-05-24 NOTE — Telephone Encounter (Signed)
-----   Message from Vallarie MareHannah E Peters sent at 05/24/2016 10:50 AM EDT ----- TCM w/ D. Dunn on 5/18   Thank you

## 2016-05-25 ENCOUNTER — Ambulatory Visit: Payer: PRIVATE HEALTH INSURANCE | Admitting: Cardiovascular Disease

## 2016-05-31 ENCOUNTER — Encounter: Payer: Self-pay | Admitting: Physician Assistant

## 2016-05-31 NOTE — Progress Notes (Signed)
Cardiology Office Note    Date:  06/01/2016  ID:  Javell, Blackburn [birth year], MRN [id number] PCP:  [name], MD  Cardiologist:  Dr. Purvis Sheffield   Chief Complaint: f/u cath  History of Present Illness:  Harvir Patry is a 61 y.o. male with history of HTN, HLD, recently diagnosed CAD who presents for post-hospital follow-up. He was recently evaluated as OP for chest pain and found to have an abnormal stress test. He underwent LHC 05/22/16 s/p DES to pLAD; medical therapy titrated with BB and statin in addition to DAPT (rec for at least 6 months). There were minor irregularities in the Cx and no sig disease in RCA, EF 50%, LVEDP wnl. Recent 2D Echo 05/18/16: mild LVH, EF 55-60%, grade 1 DD. Recent labs showed normal CBC, Cr 0.94,  no recent LFTs/lipids.  He returns for follow-up feeling great. He's lost about 13 lbs since discharge intentionally through cutting out sugar, eating less starchy vegetables (i.e. trading mashed potatoes for cauliflower), and avoiding salt. He's not had any more chest pain. No SOB, LEE, palpitations, edema. BP continues to run high - rechecked manually by me at 152/90. He reports compliance with meds.    Past Medical History:  Diagnosis Date  . Coronary artery disease    a. [place]/abnormal nuc -> 05/22/16 PCI with DES to pLAD, EF normal  . History of gout   . Hyperlipemia   . Hypertension     Past Surgical History:  Procedure Laterality Date  . CORONARY ANGIOPLASTY WITH STENT PLACEMENT  05/22/2016   "1 stent"  . CORONARY STENT INTERVENTION N/A 05/22/2016   Procedure: Coronary Stent Intervention;  Surgeon: Lyn Records, MD;  Location: [facility] INVASIVE CV LAB;  Service: Cardiovascular;  Laterality: N/A;  . FRACTURE SURGERY    . LEFT HEART CATH AND CORONARY ANGIOGRAPHY N/A 05/22/2016   Procedure: Left Heart Cath and Coronary Angiography;  Surgeon: Lyn Records, MD;  Location: [facility] INVASIVE CV LAB;  Service: Cardiovascular;  Laterality: N/A;  . PERCUTANEOUS  PINNING PHALANX FRACTURE OF HAND Right 1990  . TONSILECTOMY, ADENOIDECTOMY, BILATERAL MYRINGOTOMY AND TUBES    . TONSILLECTOMY AND ADENOIDECTOMY    . TOOTH EXTRACTION      Current Medications: Current Outpatient Prescriptions  Medication Sig Dispense Refill  . allopurinol (ZYLOPRIM) 300 MG tablet TAKE 1 TABELT BY MOUTH DAILY  3  . aspirin EC 81 MG tablet Take 1 tablet (81 mg total) by mouth daily.    Marland Kitchen atorvastatin (LIPITOR) 80 MG tablet Take 1 tablet (80 mg total) by mouth daily. 30 tablet 6  . metoprolol tartrate (LOPRESSOR) 25 MG tablet Take 0.5 tablets (12.5 mg total) by mouth 2 (two) times daily. 60 tablet 3  . nitroGLYCERIN (NITROSTAT) 0.4 MG SL tablet Place 1 tablet (0.4 mg total) under the tongue every 5 (five) minutes as needed. 25 tablet 3  . ticagrelor (BRILINTA) 90 MG TABS tablet Take 1 tablet (90 mg total) by mouth 2 (two) times daily. 60 tablet 0   No current facility-administered medications for this visit.      Allergies:   Patient has no known allergies.   Social History   Social History  . Marital status: Married    Spouse name: N/A  . Number of children: N/A  . Years of education: N/A   Social History Main Topics  . Smoking status: Former Smoker    Years: 3.00    Types: Cigarettes    Quit date: 68  .  Smokeless tobacco: Never Used  . Alcohol use None  . Drug use: No  . Sexual activity: Not Asked   Other Topics Concern  . None   Social History Narrative  . None     Family History:  Family History  Problem Relation Age of Onset  . Lung cancer Father     ROS:   Please see the history of present illness.  All other systems are reviewed and otherwise negative.    PHYSICAL EXAM:   VS:  BP (!) 148/98   Pulse 66   Ht 5\' 11"  (1.803 m)   Wt 215 lb (97.5 kg)   SpO2 95%   BMI 29.99 kg/m   BMI: Body mass index is 29.99 kg/m. GEN: Well nourished, well developed WM, in no acute distress  HEENT: normocephalic, atraumatic Neck: no JVD, carotid  bruits, or masses Cardiac:RRR; no murmurs, rubs, or gallops, no edema  Respiratory:  clear to auscultation bilaterally, normal work of breathing GI: soft, nontender, nondistended, + BS MS: no deformity or atrophy except he does have mild swelling of top surface of right forearm/wrist where says he accidentally hit himself with a drill - this has been improving per his report Skin: warm and dry, no rash. Right radial cath site without hematoma or ecchymosis; good pulse. Neuro:  Alert and Oriented x 3, Strength and sensation are intact, follows commands Psych: euthymic mood, full affect  Wt Readings from Last 3 Encounters:  06/01/16 215 lb (97.5 kg)  05/23/16 207 lb 3.7 oz (94 kg) - suspect outlier  05/10/16 223 lb 9.6 oz (101.4 kg)      Studies/Labs Reviewed:   EKG: EKG was not ordered today.  Recent Labs: 05/23/2016: BUN 13; Creatinine, Ser 0.94; Hemoglobin 15.3; Platelets 249; Potassium 3.6; Sodium 137   Lipid Panel No results found for: CHOL, TRIG, HDL, CHOLHDL, VLDL, LDLCALC, LDLDIRECT  Additional studies/ records that were reviewed today include: Summarized above.    ASSESSMENT & PLAN:   1. CAD - feeling great. Continue ASA, BB, statin, Brilinta. He's on the fence about cardiac rehab and still undecided. We reviewed importance of long-term lifestyle changes. He seems committed to eating healthier and remaining active. 2. Essential HTN - remains elevated. Recent BPs in the hospital as high as 170s. Do not think HR will allow the titration of metoprolol needed to control BP. Will start lisinopril 10mg  daily for added benefit of nephroprotection. Check CMET today per DC summary recommendations. If stable will plan to recommend repeat BMET in 1 week. I offered the patient BP f/u either here or at PCP's office and he would like to return to PCP in 1 week to have it checked there. We reviewed goal BP of <120/80. He will let us know if he has any symptoms of low BP. 3. Hyperlipidemia -  check baseline CMET and lipids today. He's non-fasting today. If significant hypertriglyceridemia is present will repeat non-fasting. Otherwise if relatively balanced labs, will plan to recommend recheck in 6 weeks.  Disposition: F/u with Dr. Purvis SheffieldKoneswaran in 3 months. Also advised he monitor swelling on top surface of right forearm given injury to this earlier and call PCP if does not resolve. He already feels it's improved, has good ROM and does not seem particularly concerned about it.  Medication Adjustments/Labs and Tests Ordered: Current medicines are reviewed at length with the patient today.  Concerns regarding medicines are outlined above. Medication changes, Labs and Tests ordered today are summarized above and listed in the  Patient Instructions accessible in Encounters. I did not charge a TOC visit because he did not technically meet criteria with ACS or CHF.  Signed, Laurann Montana, PA-C  06/01/2016 1:55 PM    [facility] [place] Location in [facility] [address] [place], [place] [address] Ph: [phone]; Fax [phone]

## 2016-06-01 ENCOUNTER — Other Ambulatory Visit (HOSPITAL_COMMUNITY)
Admission: RE | Admit: 2016-06-01 | Discharge: 2016-06-01 | Disposition: A | Discharge status: Home / Self Care | Payer: PRIVATE HEALTH INSURANCE | Source: Ambulatory Visit | Attending: Physician Assistant | Admitting: Physician Assistant

## 2016-06-01 ENCOUNTER — Encounter: Payer: Self-pay | Admitting: Physician Assistant

## 2016-06-01 ENCOUNTER — Ambulatory Visit (INDEPENDENT_AMBULATORY_CARE_PROVIDER_SITE_OTHER): Payer: PRIVATE HEALTH INSURANCE | Admitting: Physician Assistant

## 2016-06-01 VITALS — BP 148/98 | HR 66 | Ht 71.0 in | Wt 215.0 lb

## 2016-06-01 DIAGNOSIS — E785 Hyperlipidemia, unspecified: Secondary | ICD-10-CM | POA: Insufficient documentation

## 2016-06-01 DIAGNOSIS — I10 Essential (primary) hypertension: Secondary | ICD-10-CM

## 2016-06-01 DIAGNOSIS — I2510 Atherosclerotic heart disease of native coronary artery without angina pectoris: Secondary | ICD-10-CM

## 2016-06-01 LAB — COMPREHENSIVE METABOLIC PANEL
ALT: 37 U/L (ref 17–63)
ANION GAP: 8 (ref 5–15)
AST: 33 U/L (ref 15–41)
Albumin: 4.5 g/dL (ref 3.5–5.0)
Alkaline Phosphatase: 63 U/L (ref 38–126)
BUN: 18 mg/dL (ref 6–20)
CHLORIDE: 106 mmol/L (ref 101–111)
CO2: 24 mmol/L (ref 22–32)
Calcium: 9.6 mg/dL (ref 8.9–10.3)
Creatinine, Ser: 1.01 mg/dL (ref 0.61–1.24)
Glucose, Bld: 89 mg/dL (ref 65–99)
Potassium: 4 mmol/L (ref 3.5–5.1)
SODIUM: 138 mmol/L (ref 135–145)
Total Bilirubin: 1.4 mg/dL — ABNORMAL HIGH (ref 0.3–1.2)
Total Protein: 7.6 g/dL (ref 6.5–8.1)

## 2016-06-01 LAB — LIPID PANEL
CHOL/HDL RATIO: 2.4 ratio
Cholesterol: 88 mg/dL (ref 0–200)
HDL: 36 mg/dL — AB (ref >40)
LDL CALC: 36 mg/dL (ref 0–99)
TRIGLYCERIDES: 79 mg/dL (ref <150)
VLDL: 16 mg/dL (ref 0–40)

## 2016-06-01 MED ORDER — LISINOPRIL 10 MG PO TABS: 10.0000 mg | ORAL_TABLET | Freq: Every day | ORAL | 6 refills | Status: DC

## 2016-06-01 NOTE — Patient Instructions (Signed)
Medication Instructions:  START LISINOPRIL 10 MG DAILY  Labwork: Your physician recommends that you return for lab work in: TODAY  CMET LIPIPD PANEL   Testing/Procedures: NONE  Follow-Up: Your physician recommends that you schedule a follow-up appointment in: 3 MONTHS    Any Other Special Instructions Will Be Listed Below (If Applicable). PLEASE HAVE YOUR BLOOD PRESSURE CHECKED IN 5-7 DAYS     If you need a refill on your cardiac medications before your next appointment, please call your pharmacy.

## 2016-06-04 ENCOUNTER — Telehealth: Payer: Self-pay

## 2016-06-04 DIAGNOSIS — Z79899 Other long term (current) drug therapy: Secondary | ICD-10-CM

## 2016-06-04 NOTE — Telephone Encounter (Signed)
-----   Message from Laurann Montanaayna N Dunn, [place] sent at 06/01/2016  5:02 PM EDT ----- Please let patient know labs are stable except total bilirubin is marginally high. This can be a genetic variant and not related to any particular issue, but I have no recent to compare to so he should discuss with his PCP. Given that we started lisinopril would recommend BMET in 1 week to make sure K and Cr are stable. Originally the plan was for him to go to PCP to have BP checked there in a week, but if he wants to come here and get lab and nurse BP check that may work out well so we can titrate if needed and have the labs available for review - but I am open to either option. Given recent statin initiation would recheck lipids/LFTs in 6 weeks.  Otherwise keep f/u as planned. Dayna Dunn PA-C

## 2016-06-04 NOTE — Telephone Encounter (Signed)
Pt made aware. Send copy of labs to pcp. Mailed lab slip to pt.

## 2016-06-12 ENCOUNTER — Ambulatory Visit: Payer: PRIVATE HEALTH INSURANCE | Admitting: Physician Assistant

## 2016-06-20 ENCOUNTER — Telehealth: Payer: Self-pay | Admitting: Physician Assistant

## 2016-06-20 MED ORDER — LOSARTAN POTASSIUM 50 MG PO TABS: 50.0000 mg | ORAL_TABLET | Freq: Every day | ORAL | 3 refills | Status: DC

## 2016-06-20 NOTE — Telephone Encounter (Signed)
Pt has a couple questions about his medications, would like someone to give him a call @ [phone]

## 2016-06-20 NOTE — Telephone Encounter (Signed)
-----   Message from Laurann Montanaayna N Dunn, [place] sent at 06/20/2016  1:24 PM EDT ----- Regarding: RE: medication problem We can try switching lisinopril to losartan 50mg  daily to see if it helps. Lisinopril does not usually carry this side effect so I would have him f/u with his PCP to discuss whether or not any further workup of hormones or other causes is necessary. If still no improvement can consider switching metoprolol. Would need f/u of BP in 1 week, can do either our office or PCP. Goal BP 120/80 or less. Can you make this into a phone note so there is a trail of med change? Thanks!  ----- Message ----- From: Fonnie BirkenheadMcGhee, Lunabella Badgett R, CMA Sent: 06/20/2016  12:14 PM To: Laurann Montanaayna N Dunn, PA-C Subject: RE: medication problem                         He said the metoprolol alone was okay, but when you added that other medication ( lisinopril) it went down hill from there.  ----- Message ----- From: Laurann Montanaunn, Dayna N, PA-C Sent: 06/20/2016  11:08 AM To: Fonnie BirkenheadLisa R Katiya Fike, CMA Subject: RE: medication problem                         Do you have any more information on what medications he is specifically referring to?  ----- Message ----- From: Fonnie BirkenheadMcGhee, Jaykwon Morones R, CMA Sent: 06/20/2016  10:39 AM To: Laurann Montanaayna N Dunn, PA-C Subject: medication problem                             Pt called with concerns that the medications he is on is causing problems with him and his wife. ( love life) AND he is wanting to know what else he can try because because he stated he has a beautiful [place] and he is not trying to mess up their marriage.   Please help!! Abelino DerrickLisa Shail Urbas

## 2016-06-20 NOTE — Telephone Encounter (Signed)
Called pt to advise him of medication change. He stated he will get blood pressure checked next week at pcp office. He will discuss other possible causes with pcp as well .

## 2016-07-20 ENCOUNTER — Other Ambulatory Visit: Payer: Self-pay

## 2016-07-20 MED ORDER — TICAGRELOR 90 MG PO TABS: 90.0000 mg | ORAL_TABLET | Freq: Two times a day (BID) | ORAL | 3 refills | Status: DC

## 2016-09-04 ENCOUNTER — Ambulatory Visit (INDEPENDENT_AMBULATORY_CARE_PROVIDER_SITE_OTHER): Payer: PRIVATE HEALTH INSURANCE | Admitting: Cardiovascular Disease

## 2016-09-04 ENCOUNTER — Encounter: Payer: Self-pay | Admitting: Cardiovascular Disease

## 2016-09-04 VITALS — BP 142/94 | HR 74 | Ht 71.0 in | Wt 205.0 lb

## 2016-09-04 DIAGNOSIS — I10 Essential (primary) hypertension: Secondary | ICD-10-CM | POA: Diagnosis not present

## 2016-09-04 DIAGNOSIS — Z79899 Other long term (current) drug therapy: Secondary | ICD-10-CM

## 2016-09-04 DIAGNOSIS — I25118 Atherosclerotic heart disease of native coronary artery with other forms of angina pectoris: Secondary | ICD-10-CM | POA: Diagnosis not present

## 2016-09-04 DIAGNOSIS — N522 Drug-induced erectile dysfunction: Secondary | ICD-10-CM

## 2016-09-04 DIAGNOSIS — E785 Hyperlipidemia, unspecified: Secondary | ICD-10-CM | POA: Diagnosis not present

## 2016-09-04 DIAGNOSIS — Z955 Presence of coronary angioplasty implant and graft: Secondary | ICD-10-CM | POA: Diagnosis not present

## 2016-09-04 NOTE — Patient Instructions (Addendum)
Your physician wants you to follow-up in: 6 months Dr.Koneswaran You will receive a reminder letter in the mail two months in advance. If you don't receive a letter, please call our office to schedule the follow-up appointment.  STOP Metoprolol   Your physician recommends that you continue on your current medications as directed. Please refer to the Current Medication list given to you today.    If you need a refill on your cardiac medications before your next appointment, please call your pharmacy.     Thank you for choosing [facility] !

## 2016-09-04 NOTE — Progress Notes (Signed)
SUBJECTIVE: The patient presents for routine follow up for CAD. He underwent proximal LAD stenting on 05/22/16. There was no significant disease in the RCA or circumflex. Dual antiplatelet therapy was recommended for at least 6 months.  He has modified his diet and has lost 23 lbs. He and his wife walk daily.  The patient denies any symptoms of chest pain, palpitations, shortness of breath, lightheadedness, dizziness, leg swelling, orthopnea, PND, and syncope.  He has had problems with erectile dysfunction since starting new medications.    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 Has 3 children. Friends with Dr. Neita Carp.   Review of Systems: As per "subjective", otherwise negative.  No Known Allergies  Current Outpatient Prescriptions  Medication Sig Dispense Refill  . allopurinol (ZYLOPRIM) 300 MG tablet TAKE 1 TABELT BY MOUTH DAILY  3  . aspirin EC 81 MG tablet Take 1 tablet (81 mg total) by mouth daily.    Marland Kitchen atorvastatin (LIPITOR) 80 MG tablet Take 1 tablet (80 mg total) by mouth daily. 30 tablet 6  . losartan (COZAAR) 50 MG tablet Take 1 tablet (50 mg total) by mouth daily. 90 tablet 3  . metoprolol tartrate (LOPRESSOR) 25 MG tablet Take 0.5 tablets (12.5 mg total) by mouth 2 (two) times daily. 60 tablet 3  . nitroGLYCERIN (NITROSTAT) 0.4 MG SL tablet Place 1 tablet (0.4 mg total) under the tongue every 5 (five) minutes as needed. 25 tablet 3  . ticagrelor (BRILINTA) 90 MG TABS tablet Take 1 tablet (90 mg total) by mouth 2 (two) times daily. 180 tablet 3   No current facility-administered medications for this visit.     Past Medical History:  Diagnosis Date  . Coronary artery disease    a. USA/abnormal nuc -> 05/22/16 PCI with DES to pLAD, EF normal  . History of gout   . Hyperlipemia   . Hypertension     Past Surgical History:  Procedure Laterality Date  . CORONARY ANGIOPLASTY WITH STENT PLACEMENT  05/22/2016   "1 stent"  .  CORONARY STENT INTERVENTION N/A 05/22/2016   Procedure: Coronary Stent Intervention;  Surgeon: Lyn Records, MD;  Location: [facility] INVASIVE CV LAB;  Service: Cardiovascular;  Laterality: N/A;  . FRACTURE SURGERY    . LEFT HEART CATH AND CORONARY ANGIOGRAPHY N/A 05/22/2016   Procedure: Left Heart Cath and Coronary Angiography;  Surgeon: Lyn Records, MD;  Location: [facility] INVASIVE CV LAB;  Service: Cardiovascular;  Laterality: N/A;  . PERCUTANEOUS PINNING PHALANX FRACTURE OF HAND Right 1990  . TONSILECTOMY, ADENOIDECTOMY, BILATERAL MYRINGOTOMY AND TUBES    . TONSILLECTOMY AND ADENOIDECTOMY    . TOOTH EXTRACTION      Social History   Social History  . Marital status: Married    Spouse name: N/A  . Number of children: N/A  . Years of education: N/A   Occupational History  . Not on file.   Social History Main Topics  . Smoking status: Former Smoker    Years: 3.00    Types: Cigarettes    Quit date: 46  . Smokeless tobacco: Never Used  . Alcohol use Not on file  . Drug use: No  . Sexual activity: Not on file   Other Topics Concern  . Not on file   Social History Narrative  . No narrative on file     Vitals:   09/04/16 1258  BP: (!) 142/94  Pulse: 74  SpO2: 97%  Weight:  205 lb (93 kg)  Height: 5\' 11"  (1.803 m)    Wt Readings from Last 3 Encounters:  09/04/16 205 lb (93 kg)  06/01/16 215 lb (97.5 kg)  05/23/16 207 lb 3.7 oz (94 kg)     PHYSICAL EXAM General: NAD HEENT: Normal. Neck: No JVD, no thyromegaly. Lungs: Clear to auscultation bilaterally with normal respiratory effort. CV: Nondisplaced PMI.  Regular rate and rhythm, normal S1/S2, no S3/S4, no murmur. No pretibial or periankle edema.  No carotid bruit.   Abdomen: Soft, nontender, no distention.  Neurologic: Alert and oriented.  Psych: Normal affect. Skin: Normal. Musculoskeletal: No gross deformities.    ECG: Most recent ECG reviewed.   Labs: Lab Results  Component Value Date/Time   K 4.0  06/01/2016 02:25 PM   BUN 18 06/01/2016 02:25 PM   CREATININE 1.01 06/01/2016 02:25 PM   ALT 37 06/01/2016 02:25 PM   HGB 15.3 05/23/2016 03:00 AM     Lipids: Lab Results  Component Value Date/Time   LDLCALC 36 06/01/2016 02:25 PM   CHOL 88 06/01/2016 02:25 PM   TRIG 79 06/01/2016 02:25 PM   HDL 36 (L) 06/01/2016 02:25 PM       ASSESSMENT AND PLAN: 1. CAD s/p proximal LAD stent on 05/22/16: Symptomatically stable. Continue dual antiplatelet therapy for a minimum of 6 months from date of stent implant. Will discontinue metoprolol given issues with erectile dysfunction.  2. Hypertension: BP mildly elevated. Goal is 130/80 or less. Renal function normal on 06/08/16. On losartan 50 mg. Will increase to 75 mg daily.  3. Hyperlipidemia: 06/01/16-LDL 36, HDL 36, TC 88, TG 79. Repeated by PCP one week later with no major changes. Continue statin.  4. Erectile dysfunction: I will discontinue metoprolol as this is likely the etiology.     Disposition: Follow up 6 months.   Prentice Docker, M.D., F.A.C.C.

## 2016-12-19 ENCOUNTER — Other Ambulatory Visit: Payer: Self-pay | Admitting: Cardiology

## 2017-02-26 ENCOUNTER — Telehealth: Payer: Self-pay | Admitting: Cardiovascular Disease

## 2017-02-26 NOTE — Telephone Encounter (Signed)
[name] reached out to me last week and I returned his call today. He had questions regarding medications his PCP wanted to prescribe for erectile dysfunction. I informed him not to take both nitroglycerin at the same time as phosphodiesterase inhibitors as they can lead to profound hypotension.  He also told me his PCP reduced antihypertensive therapy dosage by 50%. I asked him to keep BP monitored as goal is <130/80.

## 2017-03-05 ENCOUNTER — Ambulatory Visit (INDEPENDENT_AMBULATORY_CARE_PROVIDER_SITE_OTHER): Payer: PRIVATE HEALTH INSURANCE | Admitting: Cardiovascular Disease

## 2017-03-05 ENCOUNTER — Encounter: Payer: Self-pay | Admitting: Cardiovascular Disease

## 2017-03-05 VITALS — BP 148/74 | HR 57 | Ht 71.0 in | Wt 206.0 lb

## 2017-03-05 DIAGNOSIS — E785 Hyperlipidemia, unspecified: Secondary | ICD-10-CM | POA: Diagnosis not present

## 2017-03-05 DIAGNOSIS — I25118 Atherosclerotic heart disease of native coronary artery with other forms of angina pectoris: Secondary | ICD-10-CM | POA: Diagnosis not present

## 2017-03-05 DIAGNOSIS — Z955 Presence of coronary angioplasty implant and graft: Secondary | ICD-10-CM | POA: Diagnosis not present

## 2017-03-05 DIAGNOSIS — I10 Essential (primary) hypertension: Secondary | ICD-10-CM | POA: Diagnosis not present

## 2017-03-05 MED ORDER — TICAGRELOR 90 MG PO TABS: ORAL_TABLET | ORAL | 3 refills | Status: DC

## 2017-03-05 NOTE — Addendum Note (Signed)
Addended by: Marlyn CorporalARLTON, Zanaya Baize A on: 03/05/2017 11:45 AM   Modules accepted: Orders

## 2017-03-05 NOTE — Patient Instructions (Addendum)
Your physician wants you to follow-up in:6 months with Dr.Koneswaran You will receive a reminder letter in the mail two months in advance. If you don't receive a letter, please call our office to schedule the follow-up appointment.   Your physician recommends that you continue on your current medications as directed. Please refer to the Current Medication list given to you today.   STOP BRILINTA IN MAY 2019   If you need a refill on your cardiac medications before your next appointment, please call your pharmacy.   No lab work or tests ordered today.   Thank you for choosing [facility] !

## 2017-03-05 NOTE — Progress Notes (Signed)
SUBJECTIVE: [name] presents for routine follow-up for coronary artery disease.  He called and I spoke with him on 02/26/17 as he had questions about medications for erectile dysfunction. He underwent proximal LAD stenting on 05/22/16. There was no significant disease in the RCA or circumflex.   The patient denies any symptoms of chest pain, palpitations, shortness of breath, lightheadedness, dizziness, leg swelling, orthopnea, PND, and syncope.  He tries to walk every evening with his wife.  He is modified his diet quite a bit and eats a lot more fish and lean protein.  He told me about a recent trip he made to [place] to go fishing in [place].   Social history: He is married.  He has 2 sons.  One son graduated from [employer] and wants to obtain his doctor in physical therapy.  He has another son who does Programmer, systemsprofessional woodworking. 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 He is friends with Dr. Fara ChutePaul Johnston.   Review of Systems: As per "subjective", otherwise negative.  No Known Allergies  Current Outpatient Medications  Medication Sig Dispense Refill  . allopurinol (ZYLOPRIM) 300 MG tablet TAKE 1 TABELT BY MOUTH DAILY  3  . aspirin EC 81 MG tablet Take 1 tablet (81 mg total) by mouth daily.    Marland Kitchen. atorvastatin (LIPITOR) 80 MG tablet TAKE 1 TABLET BY MOUTH EVERY DAY 30 tablet 6  . nitroGLYCERIN (NITROSTAT) 0.4 MG SL tablet Place 1 tablet (0.4 mg total) under the tongue every 5 (five) minutes as needed. 25 tablet 3  . ticagrelor (BRILINTA) 90 MG TABS tablet Take 1 tablet (90 mg total) by mouth 2 (two) times daily. 180 tablet 3  . losartan (COZAAR) 50 MG tablet Take 1 tablet (50 mg total) by mouth daily. 90 tablet 3   No current facility-administered medications for this visit.     Past Medical History:  Diagnosis Date  . Coronary artery disease    a. [place]/abnormal nuc -> 05/22/16 PCI with DES to pLAD, EF  normal  . History of gout   . Hyperlipemia   . Hypertension     Past Surgical History:  Procedure Laterality Date  . CORONARY ANGIOPLASTY WITH STENT PLACEMENT  05/22/2016   "1 stent"  . CORONARY STENT INTERVENTION N/A 05/22/2016   Procedure: Coronary Stent Intervention;  Surgeon: Ronald RecordsSmith, Ronald W, MD;  [facility] LAB;  Service: Cardiovascular;  Laterality: N/A;  . FRACTURE SURGERY    . LEFT HEART CATH AND CORONARY ANGIOGRAPHY N/A 05/22/2016   Procedure: Left Heart Cath and Coronary Angiography;  Surgeon: Ronald RecordsSmith, Ronald W, MD;  [facility];  Service: Cardiovascular;  Laterality: N/A;  . PERCUTANEOUS PINNING PHALANX FRACTURE OF HAND Right 1990  . TONSILECTOMY, ADENOIDECTOMY, BILATERAL MYRINGOTOMY AND TUBES    . TONSILLECTOMY AND ADENOIDECTOMY    . TOOTH EXTRACTION      Social History   Socioeconomic History  . Marital status: Married    Spouse name: Not on file  . Number of children: Not on file  . Years of education: Not on file  . Highest education level: Not on file  Social Needs  . Financial resource strain: Not on file  . Food insecurity - worry: Not on file  . Food insecurity - inability: Not on file  . Transportation needs - medical: Not on file  . Transportation needs - non-medical: Not on file  Occupational History  . Not on file  Tobacco Use  . Smoking status: Former Smoker    Years: 3.00    Types: Cigarettes    Last attempt to quit: 1980    Years since quitting: 39.1  . Smokeless tobacco: Never Used  Substance and Sexual Activity  . Alcohol use: Not on file  . Drug use: No  . Sexual activity: Not on file  Other Topics Concern  . Not on file  Social History Narrative  . Not on file     Vitals:   03/05/17 1038  BP: (!) 148/74  Pulse: (!) 57  SpO2: 98%  Weight: 206 lb (93.4 kg)  Height: 5\' 11"  (1.803 m)    Wt Readings from Last 3 Encounters:  03/05/17 206 lb (93.4 kg)  09/04/16 205 lb (93 kg)  06/01/16 215 lb (97.5 kg)      PHYSICAL EXAM General: NAD HEENT: Normal. Neck: No JVD, no thyromegaly. Lungs: Clear to auscultation bilaterally with normal respiratory effort. CV: Regular rate and rhythm, normal S1/S2, no S3/S4, no murmur. No pretibial or periankle edema.  No carotid bruit.   Abdomen: Soft, nontender, no distention.  Neurologic: Alert and oriented.  Psych: Normal affect. Skin: Normal. Musculoskeletal: No gross deformities.    ECG: Most recent ECG reviewed.   Labs: Lab Results  Component Value Date/Time   K 4.0 06/01/2016 02:25 PM   BUN 18 06/01/2016 02:25 PM   CREATININE 1.01 06/01/2016 02:25 PM   ALT 37 06/01/2016 02:25 PM   HGB 15.3 05/23/2016 03:00 AM     Lipids: Lab Results  Component Value Date/Time   LDLCALC 36 06/01/2016 02:25 PM   CHOL 88 06/01/2016 02:25 PM   TRIG 79 06/01/2016 02:25 PM   HDL 36 (L) 06/01/2016 02:25 PM       ASSESSMENT AND PLAN: 1. CAD s/p proximal LAD stent on 05/22/16: Symptomatically stable. Continue dual antiplatelet therapy for 1 year after which time Brilinta will be stopped and I will continue aspirin 81 mg.  2. Hypertension: BP remains elevated.  He said it was 130/78 last week at his PCPs office.  I asked him to monitor his blood pressure and purchase a BP cuff.  He agrees to do so.  If it remains elevated, I would increase the dose of losartan.  3. Hyperlipidemia: 06/01/16-LDL 36, HDL 36, TC 88, TG 79. Repeated by PCP one week later with no major changes. Continue statin.  4. Erectile dysfunction: He is considering medical therapy.    Disposition: Follow up 6 months   Ronald Johnston, M.D., F.A.C.C.

## 2017-05-24 ENCOUNTER — Other Ambulatory Visit: Payer: Self-pay | Admitting: *Deleted

## 2017-05-24 MED ORDER — LOSARTAN POTASSIUM 50 MG PO TABS: 50.0000 mg | ORAL_TABLET | Freq: Every day | ORAL | 1 refills | Status: DC

## 2017-06-06 ENCOUNTER — Telehealth: Payer: Self-pay | Admitting: Cardiovascular Disease

## 2017-06-06 DIAGNOSIS — Z79899 Other long term (current) drug therapy: Secondary | ICD-10-CM

## 2017-06-06 NOTE — Telephone Encounter (Signed)
Returned pt call. He states that he checks his bp 2 times weekly. For the past 3 weeks he has noticed that it's trending towards the 150/88. He is not having any symptoms. He has been taking all medications as prescribed. He wanted to make Dr. Purvis Sheffield aware. I will forward to Dr. Diona Browner who is covering Dr. Junius Argyle in basket.

## 2017-06-06 NOTE — Telephone Encounter (Signed)
Pt called stating that his BP has been running a little high and would like to make Dr. Kirtland Bouchard aware.

## 2017-06-06 NOTE — Telephone Encounter (Signed)
Patient of Dr. Purvis Sheffield. He is out of the office and this message was forwarded to my inbox for review. I went over the last office visit note. Suggest increasing Cozaar to 100 mg daily, check BMET in 2 weeks. Continue to monitor blood pressure.

## 2017-06-11 MED ORDER — LOSARTAN POTASSIUM 100 MG PO TABS: 100.0000 mg | ORAL_TABLET | Freq: Every day | ORAL | 3 refills | Status: DC

## 2017-06-11 NOTE — Telephone Encounter (Signed)
Pt aware, escribed losartan 100 mg qd and mailed bmet to pt.Pt will continue to monitor BP

## 2017-06-27 ENCOUNTER — Telehealth: Payer: Self-pay | Admitting: Cardiovascular Disease

## 2017-06-27 MED ORDER — AMLODIPINE BESYLATE 5 MG PO TABS: 5.0000 mg | ORAL_TABLET | Freq: Every day | ORAL | 11 refills | Status: DC

## 2017-06-27 NOTE — Telephone Encounter (Signed)
BP 165/102 this morning, having blurred vision

## 2017-06-27 NOTE — Telephone Encounter (Signed)
Add amlodipine 5 mg daily

## 2017-06-27 NOTE — Telephone Encounter (Signed)
Patient notified and voiced understanding.

## 2017-06-27 NOTE — Telephone Encounter (Signed)
Spoke with pt who states that he feels better after eating lunch. Pt denies SOB, CP, and being dizzy. Pt rechecked BP and found it to be 171/92. Please advise

## 2017-07-10 ENCOUNTER — Other Ambulatory Visit: Payer: Self-pay | Admitting: Physician Assistant

## 2017-08-14 IMAGING — NM NM MYOCAR MULTI W/SPECT W/WALL MOTION & EF
2 series · 12 of 12 positions shown · non-contrast
Comparison: none

[Series 1: rest · 6.51mm/px · 6 of 64 frames shown]
[frame 6/64]
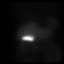
[frame 16/64]
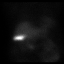
[frame 27/64]
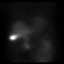
[frame 38/64]
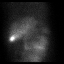
[frame 48/64]
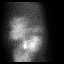
[frame 59/64]
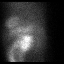

[Series 2: stress gated · 6.51mm/px · 6 of 64 frames shown]
[frame 6/64]
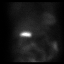
[frame 16/64]
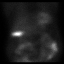
[frame 27/64]
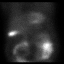
[frame 38/64]
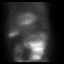
[frame 48/64]
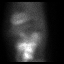
[frame 59/64]
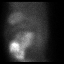

[12 of 12 positions shown; findings below may reference images not displayed]

Canned report from images found in remote index.

Refer to host system for actual result text.

## 2017-09-04 ENCOUNTER — Ambulatory Visit (INDEPENDENT_AMBULATORY_CARE_PROVIDER_SITE_OTHER): Payer: PRIVATE HEALTH INSURANCE | Admitting: Cardiovascular Disease

## 2017-09-04 ENCOUNTER — Encounter: Payer: Self-pay | Admitting: Cardiovascular Disease

## 2017-09-04 VITALS — BP 128/74 | HR 73 | Ht 71.0 in | Wt 203.2 lb

## 2017-09-04 DIAGNOSIS — E785 Hyperlipidemia, unspecified: Secondary | ICD-10-CM

## 2017-09-04 DIAGNOSIS — I25118 Atherosclerotic heart disease of native coronary artery with other forms of angina pectoris: Secondary | ICD-10-CM | POA: Diagnosis not present

## 2017-09-04 DIAGNOSIS — I10 Essential (primary) hypertension: Secondary | ICD-10-CM

## 2017-09-04 DIAGNOSIS — Z955 Presence of coronary angioplasty implant and graft: Secondary | ICD-10-CM

## 2017-09-04 NOTE — Progress Notes (Signed)
SUBJECTIVE: Ronald Johnston presents for follow-up of coronary artery disease and hypertension. He underwent proximal LAD stenting on 05/22/16. There was no significant disease in the RCA or circumflex.   He has had issues with elevated blood pressures and Cozaar was increased to 100 mg daily and I subsequently added amlodipine 5 mg daily.  He had his blood pressure checked at his PCPs office 2 weeks ago and it was normal.  The patient denies any symptoms of chest pain, palpitations, shortness of breath, lightheadedness, dizziness, leg swelling, orthopnea, PND, and syncope.  He has had progressive visual disturbances over the past year where he has been unable to read street signs.  He plans to go see his eye doctor within the next few weeks.  He continues to eat solids and tries to eat healthy during the week.  He continues to walk and swims.  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    Social history: He is married.  He has 2 sons.  One son graduated from [employer] and wants to obtain his doctor in physical therapy.  He has another son who does Programmer, systemsprofessional woodworking. 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 He is friends with Dr. Fara ChutePaul Sasser.  Review of Systems: As per "subjective", otherwise negative.  No Known Allergies  Current Outpatient Medications  Medication Sig Dispense Refill  . allopurinol (ZYLOPRIM) 300 MG tablet TAKE 1 TABELT BY MOUTH DAILY  3  . amLODipine (NORVASC) 5 MG tablet Take 1 tablet (5 mg total) by mouth daily. 30 tablet 11  . aspirin EC 81 MG tablet Take 1 tablet (81 mg total) by mouth daily.    Marland Kitchen. atorvastatin (LIPITOR) 80 MG tablet TAKE 1 TABLET BY MOUTH EVERY DAY 30 tablet 6  . losartan (COZAAR) 100 MG tablet Take 1 tablet (100 mg total) by mouth daily. 90 tablet 3  . nitroGLYCERIN (NITROSTAT) 0.4 MG SL tablet Place 1 tablet  (0.4 mg total) under the tongue every 5 (five) minutes as needed. 25 tablet 3  . ticagrelor (BRILINTA) 90 MG TABS tablet STOP Brilinta in May 2019 180 tablet 3   No current facility-administered medications for this visit.     Past Medical History:  Diagnosis Date  . Coronary artery disease    a. USA/abnormal nuc -> 05/22/16 PCI with DES to pLAD, EF normal  . History of gout   . Hyperlipemia   . Hypertension     Past Surgical History:  Procedure Laterality Date  . CORONARY ANGIOPLASTY WITH STENT PLACEMENT  05/22/2016   "1 stent"  . CORONARY STENT INTERVENTION N/A 05/22/2016   Procedure: Coronary Stent Intervention;  Surgeon: Lyn RecordsSmith, Henry W, MD;  [facility] LAB;  Service: Cardiovascular;  Laterality: N/A;  . FRACTURE SURGERY    . LEFT HEART CATH AND CORONARY ANGIOGRAPHY N/A 05/22/2016   Procedure: Left Heart Cath and Coronary Angiography;  Surgeon: Lyn RecordsSmith, Henry W, MD;  [facility];  Service: Cardiovascular;  Laterality: N/A;  . PERCUTANEOUS PINNING PHALANX FRACTURE OF HAND Right 1990  . TONSILECTOMY, ADENOIDECTOMY, BILATERAL MYRINGOTOMY AND TUBES    . TONSILLECTOMY AND ADENOIDECTOMY    . TOOTH EXTRACTION      Social History   Socioeconomic History  . Marital status: Married    Spouse name: Not on file  . Number  of children: Not on file  . Years of education: Not on file  . Highest education level: Not on file  Occupational History  . Not on file  Social Needs  . Financial resource strain: Not on file  . Food insecurity:    Worry: Not on file    Inability: Not on file  . Transportation needs:    Medical: Not on file    Non-medical: Not on file  Tobacco Use  . Smoking status: Former Smoker    Years: 3.00    Types: Cigarettes    Last attempt to quit: 1980    Years since quitting: 39.6  . Smokeless tobacco: Never Used  Substance and Sexual Activity  . Alcohol use: Not on file  . Drug use: No  . Sexual activity: Not on file  Lifestyle  .  Physical activity:    Days per week: Not on file    Minutes per session: Not on file  . Stress: Not on file  Relationships  . Social connections:    Talks on phone: Not on file    Gets together: Not on file    Attends religious service: Not on file    Active member of club or organization: Not on file    Attends meetings of clubs or organizations: Not on file    Relationship status: Not on file  . Intimate partner violence:    Fear of current or ex partner: Not on file    Emotionally abused: Not on file    Physically abused: Not on file    Forced sexual activity: Not on file  Other Topics Concern  . Not on file  Social History Narrative  . Not on file     Vitals:   09/04/17 1602  BP: 128/74  Pulse: 73  SpO2: 98%  Weight: 203 lb 3.2 oz (92.2 kg)  Height: 5\' 11"  (1.803 m)    Wt Readings from Last 3 Encounters:  09/04/17 203 lb 3.2 oz (92.2 kg)  03/05/17 206 lb (93.4 kg)  09/04/16 205 lb (93 kg)     PHYSICAL EXAM General: NAD HEENT: Normal. Neck: No JVD, no thyromegaly. Lungs: Clear to auscultation bilaterally with normal respiratory effort. CV: Regular rate and rhythm, normal S1/S2, no S3/S4, no murmur. No pretibial or periankle edema.  No carotid bruit.   Abdomen: Soft, nontender, no distention.  Neurologic: Alert and oriented.  Psych: Normal affect. Skin: Normal. Musculoskeletal: No gross deformities.    ECG: Reviewed above under Subjective   Labs: Lab Results  Component Value Date/Time   K 4.0 06/01/2016 02:25 PM   BUN 18 06/01/2016 02:25 PM   CREATININE 1.01 06/01/2016 02:25 PM   ALT 37 06/01/2016 02:25 PM   HGB 15.3 05/23/2016 03:00 AM     Lipids: Lab Results  Component Value Date/Time   LDLCALC 36 06/01/2016 02:25 PM   CHOL 88 06/01/2016 02:25 PM   TRIG 79 06/01/2016 02:25 PM   HDL 36 (L) 06/01/2016 02:25 PM       ASSESSMENT AND PLAN:  1. CAD s/p proximal LAD stent on 05/22/16: Symptomatically stable.  Continue aspirin 81 mg and  atorvastatin 80 mg.  2. Hypertension: Blood pressure is now normal with increase of Cozaar to 100 mg in addition of amlodipine 5 mg daily.  No changes.  3. Hyperlipidemia: Continue atorvastatin 80 mg.  I will obtain a copy of lipids from PCP.   Disposition: Follow up 1 year.   Prentice DockerSuresh Koneswaran, M.D., F.A.C.C.

## 2017-09-04 NOTE — Patient Instructions (Signed)

## 2018-06-05 ENCOUNTER — Other Ambulatory Visit: Payer: Self-pay | Admitting: *Deleted

## 2018-06-05 MED ORDER — LOSARTAN POTASSIUM 100 MG PO TABS: 100.0000 mg | ORAL_TABLET | Freq: Every day | ORAL | 3 refills | Status: DC

## 2018-06-16 ENCOUNTER — Other Ambulatory Visit: Payer: Self-pay

## 2018-06-16 MED ORDER — AMLODIPINE BESYLATE 5 MG PO TABS: 5.0000 mg | ORAL_TABLET | Freq: Every day | ORAL | 3 refills | Status: DC

## 2018-06-16 NOTE — Telephone Encounter (Signed)
Refilled norvasc

## 2018-09-18 ENCOUNTER — Other Ambulatory Visit: Payer: Self-pay

## 2018-09-18 ENCOUNTER — Encounter: Payer: Self-pay | Admitting: Cardiovascular Disease

## 2018-09-18 ENCOUNTER — Ambulatory Visit (INDEPENDENT_AMBULATORY_CARE_PROVIDER_SITE_OTHER): Payer: PRIVATE HEALTH INSURANCE | Admitting: Cardiovascular Disease

## 2018-09-18 VITALS — BP 146/82 | HR 86 | Temp 98.7°F | Ht 71.0 in | Wt 209.0 lb

## 2018-09-18 DIAGNOSIS — I10 Essential (primary) hypertension: Secondary | ICD-10-CM | POA: Diagnosis not present

## 2018-09-18 DIAGNOSIS — E785 Hyperlipidemia, unspecified: Secondary | ICD-10-CM

## 2018-09-18 DIAGNOSIS — Z955 Presence of coronary angioplasty implant and graft: Secondary | ICD-10-CM

## 2018-09-18 DIAGNOSIS — I25118 Atherosclerotic heart disease of native coronary artery with other forms of angina pectoris: Secondary | ICD-10-CM

## 2018-09-18 NOTE — Patient Instructions (Signed)
Medication Instructions: Your physician recommends that you continue on your current medications as directed. Please refer to the Current Medication list given to you today.   Labwork: None today  Procedures/Testing: None  Follow-Up: 1 year with Dr.Koneswaran  Any Additional Special Instructions Will Be Listed Below (If Applicable).     If you need a refill on your cardiac medications before your next appointment, please call your pharmacy.     Thank you for choosing [facility] !        

## 2018-09-18 NOTE — Progress Notes (Signed)
SUBJECTIVE: Ronald Johnston presents for follow-up of coronary artery disease and hypertension. He underwent proximal LAD stenting on 05/22/16. There was no significant disease in the RCA or circumflex.  The patient denies any symptoms of chest pain, palpitations, shortness of breath, lightheadedness, dizziness, leg swelling, orthopnea, PND, and syncope.  He works about 60 hours a week.  He push mows his own lawn.  He would like to lose about 10 pounds.   Social history:He is married. He has 2 sons. One son graduated from [employer] college and wants to obtain his doctor in physical therapy. He has another son who does Higher education careers adviser. 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 Consuello Masse.   Review of Systems: As per "subjective", otherwise negative.  No Known Allergies  Current Outpatient Medications  Medication Sig Dispense Refill  . allopurinol (ZYLOPRIM) 300 MG tablet TAKE 1 TABELT BY MOUTH DAILY  3  . amLODipine (NORVASC) 5 MG tablet Take 1 tablet (5 mg total) by mouth daily. 90 tablet 3  . aspirin EC 81 MG tablet Take 1 tablet (81 mg total) by mouth daily.    Marland Kitchen atorvastatin (LIPITOR) 80 MG tablet TAKE 1 TABLET BY MOUTH EVERY DAY 30 tablet 6  . losartan (COZAAR) 100 MG tablet Take 1 tablet (100 mg total) by mouth daily. 90 tablet 3  . nitroGLYCERIN (NITROSTAT) 0.4 MG SL tablet Place 1 tablet (0.4 mg total) under the tongue every 5 (five) minutes as needed. 25 tablet 3   No current facility-administered medications for this visit.     Past Medical History:  Diagnosis Date  . Coronary artery disease    a. [place]/abnormal nuc -> 05/22/16 PCI with DES to pLAD, EF normal  . History of gout   . Hyperlipemia   . Hypertension     Past Surgical History:  Procedure Laterality Date  . CORONARY ANGIOPLASTY WITH STENT PLACEMENT  05/22/2016   "1 stent"  . CORONARY STENT INTERVENTION N/A  05/22/2016   Procedure: Coronary Stent Intervention;  Surgeon: Belva Crome, MD;  Location: [place] [facility];  Service: Cardiovascular;  Laterality: N/A;  . FRACTURE SURGERY    . LEFT HEART CATH AND CORONARY ANGIOGRAPHY N/A 05/22/2016   Procedure: Left Heart Cath and Coronary Angiography;  Surgeon: Belva Crome, MD;  Location: [place] [facility];  Service: Cardiovascular;  Laterality: N/A;  . PERCUTANEOUS PINNING PHALANX FRACTURE OF HAND Right 1990  . TONSILECTOMY, ADENOIDECTOMY, BILATERAL MYRINGOTOMY AND TUBES    . TONSILLECTOMY AND ADENOIDECTOMY    . TOOTH EXTRACTION      Social History   Socioeconomic History  . Marital status: Married    Spouse name: Not on file  . Number of children: Not on file  . Years of education: Not on file  . Highest education level: Not on file  Occupational History  . Not on file  Social Needs  . Financial resource strain: Not on file  . Food insecurity    Worry: Not on file    Inability: Not on file  . Transportation needs    Medical: Not on file    Non-medical: Not on file  Tobacco Use  . Smoking status: Former Smoker    Years: 3.00    Types: Cigarettes    Quit date: 1980    Years since quitting: 40.7  . Smokeless tobacco: Never Used  Substance and Sexual Activity  . Alcohol  use: Not on file  . Drug use: No  . Sexual activity: Not on file  Lifestyle  . Physical activity    Days per week: Not on file    Minutes per session: Not on file  . Stress: Not on file  Relationships  . Social Musicianconnections    Talks on phone: Not on file    Gets together: Not on file    Attends religious service: Not on file    Active member of club or organization: Not on file    Attends meetings of clubs or organizations: Not on file    Relationship status: Not on file  . Intimate partner violence    Fear of current or ex partner: Not on file    Emotionally abused: Not on file    Physically abused: Not on file    Forced sexual activity: Not on file   Other Topics Concern  . Not on file  Social History Narrative  . Not on file     Vitals:   09/18/18 1543  BP: (!) 146/82  Pulse: 86  Temp: 98.7 F (37.1 C)  SpO2: 96%  Weight: 209 lb (94.8 kg)  Height: 5\' 11"  (1.803 m)    Wt Readings from Last 3 Encounters:  09/18/18 209 lb (94.8 kg)  09/04/17 203 lb 3.2 oz (92.2 kg)  03/05/17 206 lb (93.4 kg)     PHYSICAL EXAM General: NAD HEENT: Normal. Neck: No JVD, no thyromegaly. Lungs: Clear to auscultation bilaterally with normal respiratory effort. CV: Regular rate and rhythm, normal S1/S2, no S3/S4, no murmur. No pretibial or periankle edema.  No carotid bruit.   Abdomen: Soft, nontender, no distention.  Neurologic: Alert and oriented.  Psych: Normal affect. Skin: Normal. Musculoskeletal: No gross deformities.    ECG: Reviewed above under Subjective   Labs: Lab Results  Component Value Date/Time   K 4.0 06/01/2016 02:25 PM   BUN 18 06/01/2016 02:25 PM   CREATININE 1.01 06/01/2016 02:25 PM   ALT 37 06/01/2016 02:25 PM   HGB 15.3 05/23/2016 03:00 AM     Lipids: Lab Results  Component Value Date/Time   LDLCALC 36 06/01/2016 02:25 PM   CHOL 88 06/01/2016 02:25 PM   TRIG 79 06/01/2016 02:25 PM   HDL 36 (L) 06/01/2016 02:25 PM       ASSESSMENT AND PLAN: 1. CAD s/p proximal LAD stent on 05/22/16: Symptomatically stable.  Continue aspirin 81 mg and atorvastatin 80 mg.  2. Hypertension: Blood pressure is mildly elevated.  He wants to lose 10 pounds.  No changes to medications at this time.  3. Hyperlipidemia: Continue atorvastatin 80 mg.  I will obtain a copy of lipids from PCP.   Disposition: Follow up 1 year   Prentice DockerSuresh Lilygrace Rodick, M.D., F.A.C.C.

## 2019-06-10 ENCOUNTER — Other Ambulatory Visit: Payer: Self-pay | Admitting: Cardiovascular Disease

## 2019-06-24 ENCOUNTER — Other Ambulatory Visit: Payer: Self-pay | Admitting: Cardiovascular Disease

## 2019-12-01 ENCOUNTER — Encounter: Payer: Self-pay | Admitting: Cardiology

## 2019-12-01 ENCOUNTER — Ambulatory Visit (INDEPENDENT_AMBULATORY_CARE_PROVIDER_SITE_OTHER): Payer: PRIVATE HEALTH INSURANCE | Admitting: Cardiology

## 2019-12-01 ENCOUNTER — Other Ambulatory Visit: Payer: Self-pay

## 2019-12-01 VITALS — BP 160/88 | HR 62 | Ht 71.0 in | Wt 214.0 lb

## 2019-12-01 DIAGNOSIS — E785 Hyperlipidemia, unspecified: Secondary | ICD-10-CM

## 2019-12-01 DIAGNOSIS — I10 Essential (primary) hypertension: Secondary | ICD-10-CM

## 2019-12-01 DIAGNOSIS — I2510 Atherosclerotic heart disease of native coronary artery without angina pectoris: Secondary | ICD-10-CM

## 2019-12-01 NOTE — Patient Instructions (Signed)
Medication Instructions:  Your physician recommends that you continue on your current medications as directed. Please refer to the Current Medication list given to you today.  *If you need a refill on your cardiac medications before your next appointment, please call your pharmacy*   Lab Work: NONE   If you have labs (blood work) drawn today and your tests are completely normal, you will receive your results only by:  MyChart Message (if you have MyChart) OR  A paper copy in the mail If you have any lab test that is abnormal or we need to change your treatment, we will call you to review the results.   Testing/Procedures: Your physician has requested that you regularly monitor and record your blood pressure readings at home. Please use the same machine at the same time of day to check your readings and record them to bring to your follow-up visit.  If blood pressure is 135/85 may increase Amlodipine to 10 mg daily.     Follow-Up: At [facility], you and your health needs are our priority.  As part of our continuing mission to provide you with exceptional heart care, we have created designated Provider Care Teams.  These Care Teams include your primary Cardiologist (physician) and Advanced Practice Providers (APPs -  Physician Assistants and Nurse Practitioners) who all work together to provide you with the care you need, when you need it.  We recommend signing up for the patient portal called "MyChart".  Sign up information is provided on this After Visit Summary.  MyChart is used to connect with patients for Virtual Visits (Telemedicine).  Patients are able to view lab/test results, encounter notes, upcoming appointments, etc.  Non-urgent messages can be sent to your provider as well.   To learn more about what you can do with MyChart, go to [email or web address].    Your next appointment:   1 year(s)  The format for your next appointment:   In Person  Provider:   With MD      Other Instructions Thank you for choosing [facility]!

## 2019-12-01 NOTE — Assessment & Plan Note (Signed)
LDL 50 last year- PCP follows

## 2019-12-01 NOTE — Assessment & Plan Note (Signed)
LAD PCI in 2018- no history of chest pain

## 2019-12-01 NOTE — Assessment & Plan Note (Signed)
B/P is elevated today though the patient says this is unusual for him.  I have asked him to monitor his blood pressure 3 times a week and keep a record.  If he finds his pressure is consistently running greater than 135/85 he will increase his amlodipine to 10 mg a day and follow-up with Dr. Neita Carp.  He knows to avoid sodium.

## 2019-12-01 NOTE — Progress Notes (Signed)
Cardiology Office Note:    Date:  12/01/2019   ID:  Ronald Johnston, DOB January 04, 1956, MRN [id number]  PCP:  [name], MD  Cardiologist:  Sidney Ace Electrophysiologist:  None   Referring MD: [name], MD   No chief complaint on file. one year follow up  History of Present Illness:    Ronald Johnston is a 64 y.o. male with a hx of coronary disease.  Patient had an LAD intervention in 2018.  He had been noticing some chest discomfort at work and was referred to [place] by his primary care provider.  An outpatient nuclear study was abnormal and he subsequently underwent diagnostic catheterization and LAD intervention.  He had no other significant coronary disease at cath.  He has treated dyslipidemia and hypertension as well.  He seen in the office today for a 1 year follow-up.  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  He works a physical job several hours a day.  He says he is not noticed any unusual chest pain or shortness of breath.  His blood pressure in the office today was initially elevated at 160/88.  I rechecked this myself and got 180/90.  The patient says this is an unusual reading for him, he tells me he was recently at Dr. Dian Situ office and his blood pressure was in the 130s over 70.  He did admit that he had been eating some garden pickles last night while watching the ball game on TV and may have gotten an excess salt load.  Past Medical History:  Diagnosis Date  . Coronary artery disease    a. [place]/abnormal nuc -> 05/22/16 PCI with DES to pLAD, EF normal  . History of gout   . Hyperlipemia   . Hypertension     Past Surgical History:  Procedure Laterality Date  . CORONARY ANGIOPLASTY WITH STENT PLACEMENT  05/22/2016   "1 stent"  . CORONARY STENT INTERVENTION N/A 05/22/2016   Procedure: Coronary Stent Intervention;  Surgeon: Lyn Records, MD;  Location: [facility];  Service: Cardiovascular;  Laterality: N/A;  . FRACTURE SURGERY    . LEFT HEART CATH AND  CORONARY ANGIOGRAPHY N/A 05/22/2016   Procedure: Left Heart Cath and Coronary Angiography;  Surgeon: Lyn Records, MD;  Location: [facility];  Service: Cardiovascular;  Laterality: N/A;  . PERCUTANEOUS PINNING PHALANX FRACTURE OF HAND Right 1990  . TONSILECTOMY, ADENOIDECTOMY, BILATERAL MYRINGOTOMY AND TUBES    . TONSILLECTOMY AND ADENOIDECTOMY    . TOOTH EXTRACTION      Current Medications: Current Meds  Medication Sig  . allopurinol (ZYLOPRIM) 300 MG tablet TAKE 1 TABELT BY MOUTH DAILY  . amLODipine (NORVASC) 5 MG tablet TAKE 1 TABLET BY MOUTH EVERY DAY  . aspirin EC 81 MG tablet Take 1 tablet (81 mg total) by mouth daily.  Marland Kitchen atorvastatin (LIPITOR) 80 MG tablet TAKE 1 TABLET BY MOUTH EVERY DAY  . losartan (COZAAR) 100 MG tablet TAKE 1 TABLET BY MOUTH EVERY DAY  . nitroGLYCERIN (NITROSTAT) 0.4 MG SL tablet Place 1 tablet (0.4 mg total) under the tongue every 5 (five) minutes as needed.     Allergies:   Patient has no known allergies.   Social History   Socioeconomic History  . Marital status: Married    Spouse name: Not on file  . Number of children: Not on file  . Years of education: Not on file  . Highest education level: Not on file  Occupational History  . Not on file  Tobacco Use  . Smoking status: Former Smoker    Years: 3.00    Types: Cigarettes    Quit date: 1980    Years since quitting: 41.9  . Smokeless tobacco: Never Used  Vaping Use  . Vaping Use: Never used  Substance and Sexual Activity  . Alcohol use: Yes    Alcohol/week: 2.0 standard drinks    Types: 2 Cans of beer per week  . Drug use: No  . Sexual activity: Not on file  Other Topics Concern  . Not on file  Social History Narrative  . Not on file   Social Determinants of Health   Financial Resource Strain:   . Difficulty of Paying Living Expenses: Not on file  Food Insecurity:   . Worried About Programme researcher, broadcasting/film/video in the Last Year: Not on file  . Ran Out of Food in the Last Year: Not  on file  Transportation Needs:   . Lack of Transportation (Medical): Not on file  . Lack of Transportation (Non-Medical): Not on file  Physical Activity:   . Days of Exercise per Week: Not on file  . Minutes of Exercise per Session: Not on file  Stress:   . Feeling of Stress : Not on file  Social Connections:   . Frequency of Communication with Friends and Family: Not on file  . Frequency of Social Gatherings with Friends and Family: Not on file  . Attends Religious Services: Not on file  . Active Member of Clubs or Organizations: Not on file  . Attends Banker Meetings: Not on file  . Marital Status: Not on file     Family History: The patient's family history includes Lung cancer in his father.  ROS:   Please see the history of present illness.    All other systems reviewed and are negative.  EKGs/Labs/Other Studies Reviewed:    The following studies were reviewed today: Cath/PCI 05/23/2019-  Thrombotic eccentric 85-95% proximal LAD stenosis. Intermediate stenosis in the mid LAD over a more diffuse area.  Irregularities noted in the circumflex artery but no high-grade obstruction.  Widely patent right coronary vessel arises anterior from the right sinus of Valsalva.  Successful angioplasty and stenting of the proximal LAD 90% stenosis with reduction to 0% using a 3.5 x 15 Onyx DES postdilated to 3.75 mm in diameter. TIMI grade 3 flow was noted. No complications.  Normal LV function. EF greater than 50%. LVEDP is normal.  Recommendations:   Aspirin and brilinta for at least 6 months.  Aggressive risk factor modification.  Cardiac rehabilitation, phase II   EKG:  EKG is ordered today.  The ekg ordered today demonstrates NSR- SB 53  Recent Labs: No results found for requested labs within last 8760 hours.  Recent Lipid Panel    Component Value Date/Time   CHOL 88 06/01/2016 1425   TRIG 79 06/01/2016 1425   HDL 36 (L) 06/01/2016 1425   CHOLHDL 2.4  06/01/2016 1425   VLDL 16 06/01/2016 1425   LDLCALC 36 06/01/2016 1425    Physical Exam:    VS:  BP (!) 160/88   Pulse 62   Ht 5\' 11"  (1.803 m)   Wt 214 lb (97.1 kg) Comment: pt stated  SpO2 96%   BMI 29.85 kg/m     Wt Readings from Last 3 Encounters:  12/01/19 214 lb (97.1 kg)  09/18/18 209 lb (94.8 kg)  09/04/17 203 lb 3.2  oz (92.2 kg)     GEN: Well nourished, well developed in no acute distress HEENT: Normal NECK: No JVD; No carotid bruits  CARDIAC: RRR, no murmurs, rubs, gallops, +S4 RESPIRATORY:  Clear to auscultation without rales, wheezing or rhonchi  ABDOMEN: Soft, non-tender, non-distended MUSCULOSKELETAL:  No edema; No deformity  SKIN: Warm and dry NEUROLOGIC:  Alert and oriented x 3 PSYCHIATRIC:  Normal affect   ASSESSMENT:    Essential hypertension B/P is elevated today though the patient says this is unusual for him.  I have asked him to monitor his blood pressure 3 times a week and keep a record.  If he finds his pressure is consistently running greater than 135/85 he will increase his amlodipine to 10 mg a day and follow-up with Dr. Neita Carp.  He knows to avoid sodium.  CAD (coronary artery disease), native coronary artery LAD PCI in 2018- no history of chest pain  Dyslipidemia, goal LDL below 70 LDL 50 last year- PCP follows  PLAN:    He will monitor his B/P and increase his Norvasc to 10 mg for consistent readings of 135/85 or higher.  F/U one year.    Medication Adjustments/Labs and Tests Ordered: Current medicines are reviewed at length with the patient today.  Concerns regarding medicines are outlined above.  Orders Placed This Encounter  Procedures  . EKG 12-Lead   No orders of the defined types were placed in this encounter.   Patient Instructions  Medication Instructions:  Your physician recommends that you continue on your current medications as directed. Please refer to the Current Medication list given to you today.  *If you need a  refill on your cardiac medications before your next appointment, please call your pharmacy*   Lab Work: NONE   If you have labs (blood work) drawn today and your tests are completely normal, you will receive your results only by: Marland Kitchen MyChart Message (if you have MyChart) OR . A paper copy in the mail If you have any lab test that is abnormal or we need to change your treatment, we will call you to review the results.   Testing/Procedures: Your physician has requested that you regularly monitor and record your blood pressure readings at home. Please use the same machine at the same time of day to check your readings and record them to bring to your follow-up visit.  If blood pressure is 135/85 may increase Amlodipine to 10 mg daily.     Follow-Up: At [facility], you and your health needs are our priority.  As part of our continuing mission to provide you with exceptional heart care, we have created designated Provider Care Teams.  These Care Teams include your primary Cardiologist (physician) and Advanced Practice Providers (APPs -  Physician Assistants and Nurse Practitioners) who all work together to provide you with the care you need, when you need it.  We recommend signing up for the patient portal called "MyChart".  Sign up information is provided on this After Visit Summary.  MyChart is used to connect with patients for Virtual Visits (Telemedicine).  Patients are able to view lab/test results, encounter notes, upcoming appointments, etc.  Non-urgent messages can be sent to your provider as well.   To learn more about what you can do with MyChart, go to [email or web address].    Your next appointment:   1 year(s)  The format for your next appointment:   In Person  Provider:   With MD    Other Instructions Thank  you for choosing [facility]!       Signed, Corine ShelterLuke Veronica Guerrant, PA-C  12/01/2019 11:08 AM     [facility]

## 2020-10-12 DIAGNOSIS — M5412 Radiculopathy, cervical region: Secondary | ICD-10-CM | POA: Diagnosis not present

## 2020-10-17 DIAGNOSIS — E7849 Other hyperlipidemia: Secondary | ICD-10-CM | POA: Diagnosis not present

## 2020-10-17 DIAGNOSIS — M109 Gout, unspecified: Secondary | ICD-10-CM | POA: Diagnosis not present

## 2020-10-17 DIAGNOSIS — E782 Mixed hyperlipidemia: Secondary | ICD-10-CM | POA: Diagnosis not present

## 2020-10-17 DIAGNOSIS — R748 Abnormal levels of other serum enzymes: Secondary | ICD-10-CM | POA: Diagnosis not present

## 2020-10-17 DIAGNOSIS — I10 Essential (primary) hypertension: Secondary | ICD-10-CM | POA: Diagnosis not present

## 2020-10-17 DIAGNOSIS — Z79899 Other long term (current) drug therapy: Secondary | ICD-10-CM | POA: Diagnosis not present

## 2020-10-20 DIAGNOSIS — M109 Gout, unspecified: Secondary | ICD-10-CM | POA: Diagnosis not present

## 2020-10-20 DIAGNOSIS — I2510 Atherosclerotic heart disease of native coronary artery without angina pectoris: Secondary | ICD-10-CM | POA: Diagnosis not present

## 2020-10-20 DIAGNOSIS — R748 Abnormal levels of other serum enzymes: Secondary | ICD-10-CM | POA: Diagnosis not present

## 2020-10-20 DIAGNOSIS — E7849 Other hyperlipidemia: Secondary | ICD-10-CM | POA: Diagnosis not present

## 2020-10-20 DIAGNOSIS — Z23 Encounter for immunization: Secondary | ICD-10-CM | POA: Diagnosis not present

## 2020-10-20 DIAGNOSIS — Z1389 Encounter for screening for other disorder: Secondary | ICD-10-CM | POA: Diagnosis not present

## 2020-10-20 DIAGNOSIS — I10 Essential (primary) hypertension: Secondary | ICD-10-CM | POA: Diagnosis not present

## 2020-12-06 NOTE — Progress Notes (Signed)
Cardiology Office Note    Date:  12/07/2020   ID:  Jance, Siek 1955/12/22, MRN [id number]  PCP:  [name], MD  Cardiologist: Previously Dr. Purvis Sheffield --> Needs to switch to new MD  Chief Complaint  Patient presents with   Follow-up    Annual Visit    History of Present Illness:    Ronald Johnston is a 65 y.o. male with past medical history of CAD (s/p DES to proximal-LAD in 05/2016), HTN and HLD who presents to the office today for annual follow-up.   He was last examined by Corine Shelter, Ronald Johnston in 11/2019 and denied any recent anginal symptoms. BP was elevated and he reported consuming extra sodium in his diet. He was encouraged to titrate his Amlodipine to 10mg  daily if BP remained above goal. He was continued on ASA, Atorvastatin and Losartan at current dosing.   In talking with the patient today, he reports overall feeling well since his last office visit. He does work for himself as an and remains active at baseline. He is starting to walk again for exercise and denies any recent chest pain or dyspnea on exertion with this. No recent orthopnea, PND, lower extremity edema or palpitations.  His blood pressure is slightly elevated at 140/88 during today's visit but he reports he has been eating boiled peanuts throughout the week.   Past Medical History:  Diagnosis Date   Coronary artery disease    a. [place]/abnormal nuc -> 05/22/16 PCI with DES to pLAD, EF normal   History of gout    Hyperlipemia    Hypertension     Past Surgical History:  Procedure Laterality Date   CORONARY ANGIOPLASTY WITH STENT PLACEMENT  05/22/2016   "1 stent"   CORONARY STENT INTERVENTION N/A 05/22/2016   Procedure: Coronary Stent Intervention;  Surgeon: 07/22/2016, MD;  Location: [facility] INVASIVE CV LAB;  Service: Cardiovascular;  Laterality: N/A;   FRACTURE SURGERY     LEFT HEART CATH AND CORONARY ANGIOGRAPHY N/A 05/22/2016   Procedure: Left Heart Cath and Coronary Angiography;   Surgeon: 07/22/2016, MD;  Location: [facility] INVASIVE CV LAB;  Service: Cardiovascular;  Laterality: N/A;   PERCUTANEOUS PINNING PHALANX FRACTURE OF HAND Right 1990   TONSILECTOMY, ADENOIDECTOMY, BILATERAL MYRINGOTOMY AND TUBES     TONSILLECTOMY AND ADENOIDECTOMY     TOOTH EXTRACTION      Current Medications: Outpatient Medications Prior to Visit  Medication Sig Dispense Refill   allopurinol (ZYLOPRIM) 300 MG tablet TAKE 1 TABELT BY MOUTH DAILY  3   amLODipine (NORVASC) 5 MG tablet TAKE 1 TABLET BY MOUTH EVERY DAY 90 tablet 3   aspirin EC 81 MG tablet Take 1 tablet (81 mg total) by mouth daily.     atorvastatin (LIPITOR) 80 MG tablet TAKE 1 TABLET BY MOUTH EVERY DAY 30 tablet 6   losartan (COZAAR) 100 MG tablet TAKE 1 TABLET BY MOUTH EVERY DAY 90 tablet 3   nitroGLYCERIN (NITROSTAT) 0.4 MG SL tablet Place 1 tablet (0.4 mg total) under the tongue every 5 (five) minutes as needed. 25 tablet 3   No facility-administered medications prior to visit.     Allergies:   Patient has no known allergies.   Social History   Socioeconomic History   Marital status: Married    Spouse name: Not on file   Number of children: Not on file   Years of education: Not on file   Highest education level: Not  on file  Occupational History   Not on file  Tobacco Use   Smoking status: Former    Years: 3.00    Types: Cigarettes    Quit date: 1980    Years since quitting: 42.9   Smokeless tobacco: Never  Vaping Use   Vaping Use: Never used  Substance and Sexual Activity   Alcohol use: Yes    Alcohol/week: 2.0 standard drinks    Types: 2 Cans of beer per week   Drug use: No   Sexual activity: Not on file  Other Topics Concern   Not on file  Social History Narrative   Not on file   Social Determinants of Health   Financial Resource Strain: Not on file  Food Insecurity: Not on file  Transportation Needs: Not on file  Physical Activity: Not on file  Stress: Not on file  Social Connections: Not  on file     Family History:  The patient's family history includes Lung cancer in his father.   Review of Systems:    Please see the history of present illness.     All other systems reviewed and are otherwise negative except as noted above.   Physical Exam:    VS:  BP 140/88   Pulse 67   Ht 5\' 11"  (1.803 m)   Wt 217 lb (98.4 kg) Comment: pt states  SpO2 96%   BMI 30.27 kg/m    General: Well developed, well nourished,male appearing in no acute distress. Head: Normocephalic, atraumatic. Neck: No carotid bruits. JVD not elevated.  Lungs: Respirations regular and unlabored, without wheezes or rales.  Heart: Regular rate and rhythm. No S3 or S4.  No murmur, no rubs, or gallops appreciated. Abdomen: Appears non-distended. No obvious abdominal masses. Msk:  Strength and tone appear normal for age. No obvious joint deformities or effusions. Extremities: No clubbing or cyanosis. No pitting edema.  Distal pedal pulses are 2+ bilaterally. Neuro: Alert and oriented X 3. Moves all extremities spontaneously. No focal deficits noted. Psych:  Responds to questions appropriately with a normal affect. Skin: No rashes or lesions noted  Wt Readings from Last 3 Encounters:  12/07/20 217 lb (98.4 kg)  12/01/19 214 lb (97.1 kg)  09/18/18 209 lb (94.8 kg)     Studies/Labs Reviewed:   EKG:  EKG is ordered today. The EKG ordered today demonstrates sinus bradycardia, HR 58 with isolated TWI along Lead III. No acute ST changes when compared to prior tracings.   Recent Labs: No results found for requested labs within last 8760 hours.   Lipid Panel    Component Value Date/Time   CHOL 88 06/01/2016 1425   TRIG 79 06/01/2016 1425   HDL 36 (L) 06/01/2016 1425   CHOLHDL 2.4 06/01/2016 1425   VLDL 16 06/01/2016 1425   LDLCALC 36 06/01/2016 1425    Additional studies/ records that were reviewed today include:   Echocardiogram: 05/2016 Study Conclusions   - Left ventricle: The cavity size  was normal. Wall thickness was    increased in a pattern of mild LVH. Systolic function was normal.    The estimated ejection fraction was in the range of 55% to 60%.    Wall motion was normal; there were no regional wall motion    abnormalities. Doppler parameters are consistent with abnormal    left ventricular relaxation (grade 1 diastolic dysfunction).  - Aortic valve: There was trivial regurgitation.  - Mitral valve: Mildly thickened leaflets .   Cardiac Catheterization: 05/2016  Thrombotic eccentric 85-95% proximal LAD stenosis. Intermediate stenosis in the mid LAD over a more diffuse area. Irregularities noted in the circumflex artery but no high-grade obstruction. Widely patent right coronary vessel arises anterior from the right sinus of Valsalva. Successful angioplasty and stenting of the proximal LAD 90% stenosis with reduction to 0% using a 3.5 x 15 Onyx DES postdilated to 3.75 mm in diameter. TIMI grade 3 flow was noted. No complications. Normal LV function. EF greater than 50%. LVEDP is normal.   Recommendations:   Aspirin and brilinta for at least 6 months. Aggressive risk factor modification. Cardiac rehabilitation, phase II  Assessment:    1. Coronary artery disease involving native coronary artery of native heart without angina pectoris   2. Essential hypertension   3. Dyslipidemia, goal LDL below 70      Plan:   In order of problems listed above:  1. CAD - He is s/p DES to proximal-LAD in 05/2016. He remains active at baseline and denies any recent anginal symptoms.  Continue current medication regimen with ASA 81 mg daily and Atorvastatin 80 mg daily. He has not been on beta-blocker therapy given intermittent bradycardia. Will update Rx for SL NTG.   2. HTN - His BP is mildly elevated at 140/88 during today's visit but he has been consuming a more high sodium diet as outlined above. I did recommend reduction in sodium intake and that he follow BP at home and  make [place] aware if this remains elevated. Continue current medication regimen for now with Amlodipine 5 mg daily and Losartan 100 mg daily.  3. HLD - Followed by his PCP. Will request a copy of most recent labs. He remains on Atorvastatin 80 mg daily with goal LDL less than 70 in the setting of known CAD.    Medication Adjustments/Labs and Tests Ordered: Current medicines are reviewed at length with the patient today.  Concerns regarding medicines are outlined above.  Medication changes, Labs and Tests ordered today are listed in the Patient Instructions below. Patient Instructions  Medication Instructions:   Keep track of your Blood Pressure and update [place] of any changes.   *If you need a refill on your cardiac medications before your next appointment, please call your pharmacy*   Lab Work: NONE   If you have labs (blood work) drawn today and your tests are completely normal, you will receive your results only by: MyChart Message (if you have MyChart) OR A paper copy in the mail If you have any lab test that is abnormal or we need to change your treatment, we will call you to review the results.   Testing/Procedures: NONE    Follow-Up: At [facility], you and your health needs are our priority.  As part of our continuing mission to provide you with exceptional heart care, we have created designated Provider Care Teams.  These Care Teams include your primary Cardiologist (physician) and Advanced Practice Providers (APPs -  Physician Assistants and Nurse Practitioners) who all work together to provide you with the care you need, when you need it.  We recommend signing up for the patient portal called "MyChart".  Sign up information is provided on this After Visit Summary.  MyChart is used to connect with patients for Virtual Visits (Telemedicine).  Patients are able to view lab/test results, encounter notes, upcoming appointments, etc.  Non-urgent messages can be sent to your provider  as well.   To learn more about what you can do with MyChart, go  to [email or web address].    Your next appointment:   1 year(s)  The format for your next appointment:   In Person  Provider:   Randall An, Ronald Johnston or with MD      Other Instructions Thank you for choosing [facility]!     Signed, Ronald Lennox, Ronald Johnston  12/07/2020 4:58 PM     [facility] [address] [place], [place] [address] Phone: [phone] Fax: [phone]

## 2020-12-07 ENCOUNTER — Other Ambulatory Visit: Payer: Self-pay

## 2020-12-07 ENCOUNTER — Ambulatory Visit (INDEPENDENT_AMBULATORY_CARE_PROVIDER_SITE_OTHER): Payer: Medicare Other | Admitting: Student

## 2020-12-07 ENCOUNTER — Encounter: Payer: Self-pay | Admitting: Student

## 2020-12-07 VITALS — BP 140/88 | HR 67 | Ht 71.0 in | Wt 217.0 lb

## 2020-12-07 DIAGNOSIS — I10 Essential (primary) hypertension: Secondary | ICD-10-CM

## 2020-12-07 DIAGNOSIS — I2510 Atherosclerotic heart disease of native coronary artery without angina pectoris: Secondary | ICD-10-CM | POA: Diagnosis not present

## 2020-12-07 DIAGNOSIS — E785 Hyperlipidemia, unspecified: Secondary | ICD-10-CM

## 2020-12-07 MED ORDER — NITROGLYCERIN 0.4 MG SL SUBL: 0.4000 mg | SUBLINGUAL_TABLET | SUBLINGUAL | 3 refills | Status: AC | PRN

## 2020-12-07 NOTE — Patient Instructions (Signed)
Medication Instructions:   Keep track of your Blood Pressure and update [place] of any changes.   *If you need a refill on your cardiac medications before your next appointment, please call your pharmacy*   Lab Work: NONE   If you have labs (blood work) drawn today and your tests are completely normal, you will receive your results only by: MyChart Message (if you have MyChart) OR A paper copy in the mail If you have any lab test that is abnormal or we need to change your treatment, we will call you to review the results.   Testing/Procedures: NONE    Follow-Up: At [facility], you and your health needs are our priority.  As part of our continuing mission to provide you with exceptional heart care, we have created designated Provider Care Teams.  These Care Teams include your primary Cardiologist (physician) and Advanced Practice Providers (APPs -  Physician Assistants and Nurse Practitioners) who all work together to provide you with the care you need, when you need it.  We recommend signing up for the patient portal called "MyChart".  Sign up information is provided on this After Visit Summary.  MyChart is used to connect with patients for Virtual Visits (Telemedicine).  Patients are able to view lab/test results, encounter notes, upcoming appointments, etc.  Non-urgent messages can be sent to your provider as well.   To learn more about what you can do with MyChart, go to [email or web address].    Your next appointment:   1 year(s)  The format for your next appointment:   In Person  Provider:   Randall An, PA-C or with MD      Other Instructions Thank you for choosing [facility]!

## 2021-04-17 DIAGNOSIS — E782 Mixed hyperlipidemia: Secondary | ICD-10-CM | POA: Diagnosis not present

## 2021-04-17 DIAGNOSIS — E7849 Other hyperlipidemia: Secondary | ICD-10-CM | POA: Diagnosis not present

## 2021-04-17 DIAGNOSIS — M109 Gout, unspecified: Secondary | ICD-10-CM | POA: Diagnosis not present

## 2021-04-17 DIAGNOSIS — I10 Essential (primary) hypertension: Secondary | ICD-10-CM | POA: Diagnosis not present

## 2021-04-25 DIAGNOSIS — I10 Essential (primary) hypertension: Secondary | ICD-10-CM | POA: Diagnosis not present

## 2021-04-25 DIAGNOSIS — R748 Abnormal levels of other serum enzymes: Secondary | ICD-10-CM | POA: Diagnosis not present

## 2021-04-25 DIAGNOSIS — I2510 Atherosclerotic heart disease of native coronary artery without angina pectoris: Secondary | ICD-10-CM | POA: Diagnosis not present

## 2021-04-25 DIAGNOSIS — M109 Gout, unspecified: Secondary | ICD-10-CM | POA: Diagnosis not present

## 2021-04-25 DIAGNOSIS — E7849 Other hyperlipidemia: Secondary | ICD-10-CM | POA: Diagnosis not present

## 2021-10-14 DIAGNOSIS — M7551 Bursitis of right shoulder: Secondary | ICD-10-CM | POA: Diagnosis not present

## 2021-10-20 DIAGNOSIS — E7800 Pure hypercholesterolemia, unspecified: Secondary | ICD-10-CM | POA: Diagnosis not present

## 2021-10-20 DIAGNOSIS — I10 Essential (primary) hypertension: Secondary | ICD-10-CM | POA: Diagnosis not present

## 2021-10-20 DIAGNOSIS — M109 Gout, unspecified: Secondary | ICD-10-CM | POA: Diagnosis not present

## 2021-10-20 DIAGNOSIS — E7849 Other hyperlipidemia: Secondary | ICD-10-CM | POA: Diagnosis not present

## 2021-10-20 DIAGNOSIS — R748 Abnormal levels of other serum enzymes: Secondary | ICD-10-CM | POA: Diagnosis not present

## 2021-10-20 DIAGNOSIS — E782 Mixed hyperlipidemia: Secondary | ICD-10-CM | POA: Diagnosis not present

## 2021-10-24 DIAGNOSIS — E7849 Other hyperlipidemia: Secondary | ICD-10-CM | POA: Diagnosis not present

## 2021-10-24 DIAGNOSIS — R748 Abnormal levels of other serum enzymes: Secondary | ICD-10-CM | POA: Diagnosis not present

## 2021-10-24 DIAGNOSIS — M109 Gout, unspecified: Secondary | ICD-10-CM | POA: Diagnosis not present

## 2021-10-24 DIAGNOSIS — I2510 Atherosclerotic heart disease of native coronary artery without angina pectoris: Secondary | ICD-10-CM | POA: Diagnosis not present

## 2021-10-24 DIAGNOSIS — I10 Essential (primary) hypertension: Secondary | ICD-10-CM | POA: Diagnosis not present

## 2021-10-24 DIAGNOSIS — Z23 Encounter for immunization: Secondary | ICD-10-CM | POA: Diagnosis not present

## 2021-11-01 DIAGNOSIS — R748 Abnormal levels of other serum enzymes: Secondary | ICD-10-CM | POA: Diagnosis not present

## 2021-11-22 DIAGNOSIS — M531 Cervicobrachial syndrome: Secondary | ICD-10-CM | POA: Diagnosis not present

## 2021-11-22 DIAGNOSIS — M9901 Segmental and somatic dysfunction of cervical region: Secondary | ICD-10-CM | POA: Diagnosis not present

## 2021-11-27 DIAGNOSIS — M9901 Segmental and somatic dysfunction of cervical region: Secondary | ICD-10-CM | POA: Diagnosis not present

## 2021-11-27 DIAGNOSIS — M531 Cervicobrachial syndrome: Secondary | ICD-10-CM | POA: Diagnosis not present

## 2021-11-28 ENCOUNTER — Encounter: Payer: Self-pay | Admitting: Internal Medicine

## 2021-11-28 ENCOUNTER — Ambulatory Visit: Payer: Medicare Other | Attending: Internal Medicine | Admitting: Internal Medicine

## 2021-11-28 VITALS — BP 172/104 | HR 69 | Ht 71.0 in | Wt 225.0 lb

## 2021-11-28 DIAGNOSIS — I2510 Atherosclerotic heart disease of native coronary artery without angina pectoris: Secondary | ICD-10-CM

## 2021-11-28 MED ORDER — AMLODIPINE BESYLATE 10 MG PO TABS: 10.0000 mg | ORAL_TABLET | Freq: Every day | ORAL | 3 refills | Status: AC

## 2021-11-28 NOTE — Progress Notes (Signed)
Cardiology Office Note  Date: 11/28/2021   ID: Johnston, Ronald [birth year], MRN [id number]  PCP:  [name], MD  Cardiologist:  None Electrophysiologist:  None   Reason for Office Visit: Follow up of CAD   History of Present Illness: Ronald Johnston is a 66 y.o. male known to have CAD manifested by abnormal stress test s/p LAD PCI in 2018 with normal LVEF, HTN, HLD presented to the cardiology clinic for follow-up visit.  Patient was last seen in 11/2020. No interval ER visits or hospitalizations. Patient denied any rest or exertional chest discomfort, tightness, heaviness or pressure, rest or exertional dyspnea, palpitations, light-headedness, syncope and LE swelling. Compliant with medications and no side-effects. No bleeding complications. Denied smoking cigarettes. Physically active at baseline. >4 METS.  Patient stated that he does not check his blood pressures at home however when he visits his PCP, his blood pressure were in the range of 130s.   Past Medical History:  Diagnosis Date   Coronary artery disease    a. [place]/abnormal nuc -> 05/22/16 PCI with DES to pLAD, EF normal   History of gout    Hyperlipemia    Hypertension     Past Surgical History:  Procedure Laterality Date   CORONARY ANGIOPLASTY WITH STENT PLACEMENT  05/22/2016   "1 stent"   CORONARY STENT INTERVENTION N/A 05/22/2016   Procedure: Coronary Stent Intervention;  Surgeon: Lyn Records, MD;  Location: [facility] INVASIVE CV LAB;  Service: Cardiovascular;  Laterality: N/A;   FRACTURE SURGERY     LEFT HEART CATH AND CORONARY ANGIOGRAPHY N/A 05/22/2016   Procedure: Left Heart Cath and Coronary Angiography;  Surgeon: Lyn Records, MD;  Location: [facility] INVASIVE CV LAB;  Service: Cardiovascular;  Laterality: N/A;   PERCUTANEOUS PINNING PHALANX FRACTURE OF HAND Right 1990   TONSILECTOMY, ADENOIDECTOMY, BILATERAL MYRINGOTOMY AND TUBES     TONSILLECTOMY AND ADENOIDECTOMY     TOOTH EXTRACTION      Current  Outpatient Medications  Medication Sig Dispense Refill   allopurinol (ZYLOPRIM) 300 MG tablet TAKE 1 TABELT BY MOUTH DAILY  3   amLODipine (NORVASC) 5 MG tablet TAKE 1 TABLET BY MOUTH EVERY DAY 90 tablet 3   aspirin EC 81 MG tablet Take 1 tablet (81 mg total) by mouth daily.     atorvastatin (LIPITOR) 80 MG tablet TAKE 1 TABLET BY MOUTH EVERY DAY 30 tablet 6   losartan (COZAAR) 100 MG tablet TAKE 1 TABLET BY MOUTH EVERY DAY 90 tablet 3   nitroGLYCERIN (NITROSTAT) 0.4 MG SL tablet Place 1 tablet (0.4 mg total) under the tongue every 5 (five) minutes as needed. 25 tablet 3   No current facility-administered medications for this visit.   Allergies:  Patient has no known allergies.   Social History: The patient  reports that he quit smoking about 43 years ago. His smoking use included cigarettes. He has never used smokeless tobacco. He reports current alcohol use of about 2.0 standard drinks of alcohol per week. He reports that he does not use drugs.   Family History: The patient's family history includes Lung cancer in his father.   ROS:  Please see the history of present illness. Otherwise, complete review of systems is positive for none.  All other systems are reviewed and negative.   Physical Exam: VS:  BP (!) 172/104   Pulse 69   Ht 5\' 11"  (1.803 m)   Wt 225 lb (102.1 kg)   SpO2 97%  BMI 31.38 kg/m , BMI Body mass index is 31.38 kg/m.  Wt Readings from Last 3 Encounters:  11/28/21 225 lb (102.1 kg)  12/07/20 217 lb (98.4 kg)  12/01/19 214 lb (97.1 kg)    General: Patient appears comfortable at rest. HEENT: Conjunctiva and lids normal, oropharynx clear with moist mucosa. Neck: Supple, no elevated JVP or carotid bruits, no thyromegaly. Lungs: Clear to auscultation, nonlabored breathing at rest. Cardiac: Regular rate and rhythm, no S3 or significant systolic murmur, no pericardial rub. Abdomen: Soft, nontender, no hepatomegaly, bowel sounds present, no guarding or  rebound. Extremities: No pitting edema, distal pulses 2+. Skin: Warm and dry. Musculoskeletal: No kyphosis. Neuropsychiatric: Alert and oriented x3, affect grossly appropriate.  Recent Labwork: No results found for requested labs within last 365 days.     Component Value Date/Time   CHOL 88 06/01/2016 1425   TRIG 79 06/01/2016 1425   HDL 36 (L) 06/01/2016 1425   CHOLHDL 2.4 06/01/2016 1425   VLDL 16 06/01/2016 1425   LDLCALC 36 06/01/2016 1425    Other Studies Reviewed Today: Echo in 2018 LVEF 55-60% Grade I DD  NM stress test in 2018 Blood pressure demonstrated a normal response to exercise. Downsloping ST segment depression ST segment depression of 4 mm was noted during stress in the II, III, aVF, V4, V5 and V6 leads. Findings consistent with large area of moderate to severe ischemia in the anterior, apical, and anteroseptal walls This is a high risk study. High risk based on high risk exercise and imaging findings. High risk Duke treadmill score of negative 12. The left ventricular ejection fraction is moderately decreased (30-44%).  Assessment and Plan: Patient is a 66 year old M known to have CAD manifested by abnormal stress test in 2018 status post LAD PCI with normal LVEF, HTN, HLD presented to the cardiology clinic for follow-up visit.  #CAD manifested by abnormal stress test status post LAD PCI in 2018 with normal LVEF, currently angina free -Continue aspirin 81 mg once daily -Continue atorvastatin 80 mg nightly -He was not on beta-blocker due to intermittent bradycardia however there is no indication to be on beta-blocker for chronic CAD. -SL NTG 0.4 mg as needed  #HTN, partially controlled -Increase amlodipine dose from 5 mg to 10 mg once daily -Continue losartan 100 mg once daily  #HLD, controlled per patient -Continue atorvastatin 80 mg nightly. Goal LDL less than 70.  I have spent a total of 36 minutes with patient reviewing chart, EKGs, labs and examining  patient as well as establishing an assessment and plan that was discussed with the patient.  > 50% of time was spent in direct patient care.     Medication Adjustments/Labs and Tests Ordered: Current medicines are reviewed at length with the patient today.  Concerns regarding medicines are outlined above.   Tests Ordered: Orders Placed This Encounter  Procedures   EKG 12-Lead    Medication Changes: No orders of the defined types were placed in this encounter.   Disposition:  Follow up  1 year  Signed, Joliet Mallozzi Fidel Levy, MD, 11/28/2021 11:07 AM    [facility] [place] [address], [place], [place] [address]

## 2021-11-28 NOTE — Patient Instructions (Signed)
Medication Instructions:  Your physician has recommended you make the following change in your medication:  Increase Amlodipine to 10 mg tablets daily   Labwork: None  Testing/Procedures: None  Follow-Up: Follow up with Dr. Jenene Slicker in 1 year.   Any Other Special Instructions Will Be Listed Below (If Applicable).     If you need a refill on your cardiac medications before your next appointment, please call your pharmacy.

## 2022-04-26 DIAGNOSIS — E7849 Other hyperlipidemia: Secondary | ICD-10-CM | POA: Diagnosis not present

## 2022-04-26 DIAGNOSIS — I10 Essential (primary) hypertension: Secondary | ICD-10-CM | POA: Diagnosis not present

## 2022-04-26 DIAGNOSIS — Z79899 Other long term (current) drug therapy: Secondary | ICD-10-CM | POA: Diagnosis not present

## 2022-04-26 DIAGNOSIS — M19049 Primary osteoarthritis, unspecified hand: Secondary | ICD-10-CM | POA: Diagnosis not present

## 2022-04-26 DIAGNOSIS — E7800 Pure hypercholesterolemia, unspecified: Secondary | ICD-10-CM | POA: Diagnosis not present

## 2022-04-26 DIAGNOSIS — E782 Mixed hyperlipidemia: Secondary | ICD-10-CM | POA: Diagnosis not present

## 2022-04-26 DIAGNOSIS — R748 Abnormal levels of other serum enzymes: Secondary | ICD-10-CM | POA: Diagnosis not present

## 2022-05-01 DIAGNOSIS — H6590 Unspecified nonsuppurative otitis media, unspecified ear: Secondary | ICD-10-CM | POA: Diagnosis not present

## 2022-05-01 DIAGNOSIS — M109 Gout, unspecified: Secondary | ICD-10-CM | POA: Diagnosis not present

## 2022-05-01 DIAGNOSIS — Z23 Encounter for immunization: Secondary | ICD-10-CM | POA: Diagnosis not present

## 2022-05-01 DIAGNOSIS — R748 Abnormal levels of other serum enzymes: Secondary | ICD-10-CM | POA: Diagnosis not present

## 2022-05-01 DIAGNOSIS — J309 Allergic rhinitis, unspecified: Secondary | ICD-10-CM | POA: Diagnosis not present

## 2022-05-01 DIAGNOSIS — I2510 Atherosclerotic heart disease of native coronary artery without angina pectoris: Secondary | ICD-10-CM | POA: Diagnosis not present

## 2022-05-01 DIAGNOSIS — I10 Essential (primary) hypertension: Secondary | ICD-10-CM | POA: Diagnosis not present

## 2022-05-01 DIAGNOSIS — E7849 Other hyperlipidemia: Secondary | ICD-10-CM | POA: Diagnosis not present

## 2022-10-24 DIAGNOSIS — R748 Abnormal levels of other serum enzymes: Secondary | ICD-10-CM | POA: Diagnosis not present

## 2022-10-24 DIAGNOSIS — Z79899 Other long term (current) drug therapy: Secondary | ICD-10-CM | POA: Diagnosis not present

## 2022-10-24 DIAGNOSIS — M109 Gout, unspecified: Secondary | ICD-10-CM | POA: Diagnosis not present

## 2022-10-24 DIAGNOSIS — E7849 Other hyperlipidemia: Secondary | ICD-10-CM | POA: Diagnosis not present

## 2022-10-24 DIAGNOSIS — E782 Mixed hyperlipidemia: Secondary | ICD-10-CM | POA: Diagnosis not present

## 2022-10-24 DIAGNOSIS — E7800 Pure hypercholesterolemia, unspecified: Secondary | ICD-10-CM | POA: Diagnosis not present

## 2022-10-31 DIAGNOSIS — R748 Abnormal levels of other serum enzymes: Secondary | ICD-10-CM | POA: Diagnosis not present

## 2022-10-31 DIAGNOSIS — M1612 Unilateral primary osteoarthritis, left hip: Secondary | ICD-10-CM | POA: Diagnosis not present

## 2022-10-31 DIAGNOSIS — I10 Essential (primary) hypertension: Secondary | ICD-10-CM | POA: Diagnosis not present

## 2022-10-31 DIAGNOSIS — Z23 Encounter for immunization: Secondary | ICD-10-CM | POA: Diagnosis not present

## 2022-10-31 DIAGNOSIS — I2510 Atherosclerotic heart disease of native coronary artery without angina pectoris: Secondary | ICD-10-CM | POA: Diagnosis not present

## 2022-10-31 DIAGNOSIS — M109 Gout, unspecified: Secondary | ICD-10-CM | POA: Diagnosis not present

## 2022-10-31 DIAGNOSIS — E7849 Other hyperlipidemia: Secondary | ICD-10-CM | POA: Diagnosis not present

## 2022-12-03 ENCOUNTER — Ambulatory Visit: Payer: Medicare Other | Admitting: Internal Medicine

## 2022-12-11 ENCOUNTER — Encounter: Payer: Self-pay | Admitting: Internal Medicine

## 2022-12-11 ENCOUNTER — Ambulatory Visit: Payer: Medicare Other | Attending: Internal Medicine | Admitting: Internal Medicine

## 2022-12-11 VITALS — BP 136/78 | HR 89 | Ht 71.0 in | Wt 225.0 lb

## 2022-12-11 DIAGNOSIS — I2510 Atherosclerotic heart disease of native coronary artery without angina pectoris: Secondary | ICD-10-CM | POA: Diagnosis not present

## 2022-12-11 NOTE — Progress Notes (Addendum)
Cardiology Office Note  Date: 12/11/2022   ID: Ronald Johnston, Ronald Johnston April 03, 1955, MRN [id number]  PCP:  [name], MD  Cardiologist:  Marjo Bicker, MD Electrophysiologist:  None    History of Present Illness: Ronald Johnston is a 67 y.o. male known to have CAD manifested by abnormal stress test s/p LAD PCI in 2018 with normal LVEF, HTN, HLD presented to the cardiology clinic for follow-up visit.Marland Kitchen  No interval ER visits or hospitalizations. Patient denied any rest or exertional chest discomfort, tightness, heaviness or pressure, rest or exertional dyspnea, palpitations, light-headedness, syncope and LE swelling. Compliant with medications and no side-effects. No bleeding complications.  Physically active at baseline, more than 4 METS.  He does not check his blood pressures at home however when he visits his PCP which is twice per year, his blood pressures are in the range of 130s.  He had blood work with his PCP recently and was told everything was normal.   Past Medical History:  Diagnosis Date   Coronary artery disease    a. [place]/abnormal nuc -> 05/22/16 PCI with DES to pLAD, EF normal   History of gout    Hyperlipemia    Hypertension     Past Surgical History:  Procedure Laterality Date   CORONARY ANGIOPLASTY WITH STENT PLACEMENT  05/22/2016   "1 stent"   CORONARY STENT INTERVENTION N/A 05/22/2016   Procedure: Coronary Stent Intervention;  Surgeon: Lyn Records, MD;  Location: [facility] INVASIVE CV LAB;  Service: Cardiovascular;  Laterality: N/A;   FRACTURE SURGERY     LEFT HEART CATH AND CORONARY ANGIOGRAPHY N/A 05/22/2016   Procedure: Left Heart Cath and Coronary Angiography;  Surgeon: Lyn Records, MD;  Location: [facility] INVASIVE CV LAB;  Service: Cardiovascular;  Laterality: N/A;   PERCUTANEOUS PINNING PHALANX FRACTURE OF HAND Right 1990   TONSILECTOMY, ADENOIDECTOMY, BILATERAL MYRINGOTOMY AND TUBES     TONSILLECTOMY AND ADENOIDECTOMY     TOOTH EXTRACTION      Current  Outpatient Medications  Medication Sig Dispense Refill   allopurinol (ZYLOPRIM) 300 MG tablet TAKE 1 TABELT BY MOUTH DAILY  3   amLODipine (NORVASC) 10 MG tablet Take 1 tablet (10 mg total) by mouth daily. 90 tablet 3   aspirin EC 81 MG tablet Take 1 tablet (81 mg total) by mouth daily.     atorvastatin (LIPITOR) 80 MG tablet TAKE 1 TABLET BY MOUTH EVERY DAY 30 tablet 6   losartan (COZAAR) 100 MG tablet TAKE 1 TABLET BY MOUTH EVERY DAY 90 tablet 3   nitroGLYCERIN (NITROSTAT) 0.4 MG SL tablet Place 1 tablet (0.4 mg total) under the tongue every 5 (five) minutes as needed. 25 tablet 3   No current facility-administered medications for this visit.   Allergies:  Patient has no known allergies.   Social History: The patient  reports that he quit smoking about 44 years ago. His smoking use included cigarettes. He started smoking about 47 years ago. He has never used smokeless tobacco. He reports current alcohol use of about 2.0 standard drinks of alcohol per week. He reports that he does not use drugs.   Family History: The patient's family history includes Lung cancer in his father.   ROS:  Please see the history of present illness. Otherwise, complete review of systems is positive for none.  All other systems are reviewed and negative.   Physical Exam: VS:  There were no vitals taken for this visit., BMI There is no  height or weight on file to calculate BMI.  Wt Readings from Last 3 Encounters:  11/28/21 225 lb (102.1 kg)  12/07/20 217 lb (98.4 kg)  12/01/19 214 lb (97.1 kg)    General: Patient appears comfortable at rest. HEENT: Conjunctiva and lids normal, oropharynx clear with moist mucosa. Neck: Supple, no elevated JVP or carotid bruits, no thyromegaly. Lungs: Clear to auscultation, nonlabored breathing at rest. Cardiac: Regular rate and rhythm, no S3 or significant systolic murmur, no pericardial rub. Abdomen: Soft, nontender, no hepatomegaly, bowel sounds present, no guarding or  rebound. Extremities: No pitting edema, distal pulses 2+. Skin: Warm and dry. Musculoskeletal: No kyphosis. Neuropsychiatric: Alert and oriented x3, affect grossly appropriate.  Recent Labwork: No results found for requested labs within last 365 days.     Component Value Date/Time   CHOL 88 06/01/2016 1425   TRIG 79 06/01/2016 1425   HDL 36 (L) 06/01/2016 1425   CHOLHDL 2.4 06/01/2016 1425   VLDL 16 06/01/2016 1425   LDLCALC 36 06/01/2016 1425    Other Studies Reviewed Today: Echo in 2018 LVEF 55-60% Grade I DD  NM stress test in 2018 Blood pressure demonstrated a normal response to exercise. Downsloping ST segment depression ST segment depression of 4 mm was noted during stress in the II, III, aVF, V4, V5 and V6 leads. Findings consistent with large area of moderate to severe ischemia in the anterior, apical, and anteroseptal walls This is a high risk study. High risk based on high risk exercise and imaging findings. High risk Duke treadmill score of negative 12. The left ventricular ejection fraction is moderately decreased (30-44%).  Assessment and Plan: Patient is a 67 year old M known to have CAD manifested by abnormal stress test in 2018 status post LAD PCI with normal LVEF, HTN, HLD presented to the cardiology clinic for follow-up visit.  #CAD manifested by abnormal stress test status post LAD PCI in 2018 with normal LVEF, currently angina free -Continue aspirin 81 mg once daily, atorvastatin 80 mg nightly -Not on beta-blocker due to intermittent bradycardia but no indication for chronic CAD -SL NTG 0.4 mg as needed -ER precautions for chest pain provided  #HTN, controlled -Continue amlodipine 10 mg once daily and losartan 100 mg once daily.  #HLD, controlled per patient -Continue atorvastatin 80 mg nightly, goal LDL less than 70.  Restarted PCPs office who reported that they do not have any labs in the last couple of years.  Will obtain lipid panel.  I spent a  total duration of 30 minutes reviewing the prior notes, discussed CAD, HLD and HTN management.  Reviewed medications, documenting the findings in the note.   Medication Adjustments/Labs and Tests Ordered: Current medicines are reviewed at length with the patient today.  Concerns regarding medicines are outlined above.   Tests Ordered: Orders Placed This Encounter  Procedures   EKG 12-Lead    Medication Changes: No orders of the defined types were placed in this encounter.   Disposition:  Follow up  1 year  Signed, Harlean Regula Verne Spurr, MD, 12/11/2022 2:34 PM    [facility] [address] [place], [place] [address]

## 2022-12-11 NOTE — Addendum Note (Signed)
Addended by: Leonides Schanz C on: 12/11/2022 03:14 PM   Modules accepted: Orders

## 2022-12-11 NOTE — Patient Instructions (Signed)

## 2023-03-14 DIAGNOSIS — H524 Presbyopia: Secondary | ICD-10-CM | POA: Diagnosis not present

## 2023-05-02 DIAGNOSIS — E782 Mixed hyperlipidemia: Secondary | ICD-10-CM | POA: Diagnosis not present

## 2023-05-02 DIAGNOSIS — M109 Gout, unspecified: Secondary | ICD-10-CM | POA: Diagnosis not present

## 2023-05-02 DIAGNOSIS — R748 Abnormal levels of other serum enzymes: Secondary | ICD-10-CM | POA: Diagnosis not present

## 2023-05-02 DIAGNOSIS — E7800 Pure hypercholesterolemia, unspecified: Secondary | ICD-10-CM | POA: Diagnosis not present

## 2023-05-02 DIAGNOSIS — I10 Essential (primary) hypertension: Secondary | ICD-10-CM | POA: Diagnosis not present

## 2023-05-02 DIAGNOSIS — E7849 Other hyperlipidemia: Secondary | ICD-10-CM | POA: Diagnosis not present

## 2023-05-08 DIAGNOSIS — M1612 Unilateral primary osteoarthritis, left hip: Secondary | ICD-10-CM | POA: Diagnosis not present

## 2023-05-08 DIAGNOSIS — E782 Mixed hyperlipidemia: Secondary | ICD-10-CM | POA: Diagnosis not present

## 2023-05-08 DIAGNOSIS — M109 Gout, unspecified: Secondary | ICD-10-CM | POA: Diagnosis not present

## 2023-05-08 DIAGNOSIS — I10 Essential (primary) hypertension: Secondary | ICD-10-CM | POA: Diagnosis not present

## 2023-10-30 DIAGNOSIS — E7849 Other hyperlipidemia: Secondary | ICD-10-CM | POA: Diagnosis not present

## 2023-11-06 DIAGNOSIS — R748 Abnormal levels of other serum enzymes: Secondary | ICD-10-CM | POA: Diagnosis not present

## 2023-11-06 DIAGNOSIS — M1612 Unilateral primary osteoarthritis, left hip: Secondary | ICD-10-CM | POA: Diagnosis not present

## 2023-11-06 DIAGNOSIS — M109 Gout, unspecified: Secondary | ICD-10-CM | POA: Diagnosis not present

## 2023-11-06 DIAGNOSIS — I10 Essential (primary) hypertension: Secondary | ICD-10-CM | POA: Diagnosis not present

## 2024-02-25 ENCOUNTER — Ambulatory Visit: Admitting: Internal Medicine
